# Patient Record
Sex: Female | Born: 1990 | Race: White | Hispanic: No | Smoking: Former smoker
Health system: Southern US, Community
[De-identification: ages and names within clinical notes are randomized; demographics above are authoritative.]

## PROBLEM LIST (undated history)

## (undated) HISTORY — PX: TONSILLECTOMY AND ADENOIDECTOMY: SHX28

---
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
## 2014-10-18 ENCOUNTER — Inpatient Hospital Stay
Admit: 2014-10-18 | Disposition: A | Discharge status: Home / Self Care | Payer: Self-pay | Attending: Obstetrics & Gynecology | Admitting: Obstetrics & Gynecology

## 2014-10-18 LAB — CBC WITH DIFFERENTIAL/PLATELET
Basophil #: 0 10*3/uL (ref 0.0–0.1)
Basophil %: 0.3 %
Eosinophil #: 0.1 10*3/uL (ref 0.0–0.7)
Eosinophil %: 0.8 %
HCT: 35.9 % (ref 35.0–47.0)
HGB: 12.2 g/dL (ref 12.0–16.0)
LYMPHS ABS: 2.2 10*3/uL (ref 1.0–3.6)
Lymphocyte %: 21.3 %
MCH: 31.2 pg (ref 26.0–34.0)
MCHC: 34 g/dL (ref 32.0–36.0)
MCV: 92 fL (ref 80–100)
MONO ABS: 0.6 x10 3/mm (ref 0.2–0.9)
Monocyte %: 5.8 %
Neutrophil #: 7.4 10*3/uL — ABNORMAL HIGH (ref 1.4–6.5)
Neutrophil %: 71.8 %
PLATELETS: 211 10*3/uL (ref 150–440)
RBC: 3.91 10*6/uL (ref 3.80–5.20)
RDW: 13.4 % (ref 11.5–14.5)
WBC: 10.4 10*3/uL (ref 3.6–11.0)

## 2014-10-18 LAB — GC/CHLAMYDIA PROBE AMP

## 2014-10-21 LAB — HEMATOCRIT: HCT: 28.4 % — ABNORMAL LOW (ref 35.0–47.0)

## 2014-11-29 LAB — HM PAP SMEAR

## 2019-01-22 ENCOUNTER — Encounter: Payer: Self-pay | Admitting: Maternal Newborn

## 2019-01-25 ENCOUNTER — Other Ambulatory Visit (HOSPITAL_COMMUNITY)
Admission: RE | Admit: 2019-01-25 | Discharge: 2019-01-25 | Disposition: A | Discharge status: Home / Self Care | Payer: Managed Care, Other (non HMO) | Source: Ambulatory Visit | Attending: Maternal Newborn | Admitting: Maternal Newborn

## 2019-01-25 ENCOUNTER — Other Ambulatory Visit: Payer: Self-pay

## 2019-01-25 ENCOUNTER — Encounter: Payer: Self-pay | Admitting: Maternal Newborn

## 2019-01-25 ENCOUNTER — Ambulatory Visit (INDEPENDENT_AMBULATORY_CARE_PROVIDER_SITE_OTHER): Payer: Managed Care, Other (non HMO) | Admitting: Maternal Newborn

## 2019-01-25 VITALS — BP 100/64 | Wt 162.4 lb

## 2019-01-25 DIAGNOSIS — Z369 Encounter for antenatal screening, unspecified: Secondary | ICD-10-CM

## 2019-01-25 DIAGNOSIS — Z3480 Encounter for supervision of other normal pregnancy, unspecified trimester: Secondary | ICD-10-CM

## 2019-01-25 DIAGNOSIS — Z3481 Encounter for supervision of other normal pregnancy, first trimester: Secondary | ICD-10-CM

## 2019-01-25 DIAGNOSIS — N912 Amenorrhea, unspecified: Secondary | ICD-10-CM

## 2019-01-25 DIAGNOSIS — Z3A09 9 weeks gestation of pregnancy: Secondary | ICD-10-CM

## 2019-01-25 DIAGNOSIS — Z3201 Encounter for pregnancy test, result positive: Secondary | ICD-10-CM

## 2019-01-25 DIAGNOSIS — Z124 Encounter for screening for malignant neoplasm of cervix: Secondary | ICD-10-CM | POA: Insufficient documentation

## 2019-01-25 DIAGNOSIS — Z113 Encounter for screening for infections with a predominantly sexual mode of transmission: Secondary | ICD-10-CM

## 2019-01-25 LAB — POCT URINE PREGNANCY: Preg Test, Ur: POSITIVE — AB

## 2019-01-25 NOTE — Progress Notes (Signed)
NOB- no concerns ?

## 2019-01-25 NOTE — Progress Notes (Signed)
01/25/2019   Chief Complaint: Desires prenatal care.  Transfer of Care Patient: no  History of Present Illness: Pam Mitchell is a 28 y.o. G2P1001 at [redacted]w[redacted]d based on Patient's last menstrual period on 11/20/2018 (exact date), with an Estimated Date of Delivery: 08/27/2019, with the above CC.   Her periods were: regular periods every month, lasting 5 to 7 days She has Negative signs or symptoms of nausea/vomiting of pregnancy. She has Negative signs or symptoms of miscarriage or preterm labor She identifies Negative Zika risk factors for her and her partner On any different medications around the time she conceived/early pregnancy: No  History of varicella: No   Review of Systems  Constitutional:       Low appetite  HENT: Negative.   Eyes: Negative.   Respiratory: Negative for shortness of breath and wheezing.   Cardiovascular: Negative for chest pain and palpitations.  Gastrointestinal: Negative for abdominal pain, nausea and vomiting.  Genitourinary: Negative.   Musculoskeletal: Negative.   Skin: Negative.   Neurological: Negative.   Endo/Heme/Allergies: Negative.   Psychiatric/Behavioral: Negative.   Breasts: Positive for breast tenderness. Negative for new or changing breast lumps or nipple discharge.  Review of systems was otherwise negative, except as stated in the above HPI.  OBGYN History: As per HPI. OB History  Gravida Para Term Preterm AB Living  2 1 1     1   SAB TAB Ectopic Multiple Live Births          1    # Outcome Date GA Lbr Len/2nd Weight Sex Delivery Anes PTL Lv  2 Current           1 Term 10/20/14   7 lb 12 oz (3.515 kg) F Vag-Spont   LIV    Any issues with any prior pregnancies: no Any prior children are healthy, doing well, without any problems or issues: yes History of pap smears: Yes. Last pap smear 2015. Abnormal: No History of STIs: No   Past Medical History: History reviewed. No pertinent past medical history.  Past Surgical History: Past  Surgical History:  Procedure Laterality Date  . TONSILLECTOMY AND ADENOIDECTOMY      Family History:  Family History  Problem Relation Age of Onset  . Ovarian cancer Paternal Grandmother     She denies any female cancers, bleeding or blood clotting disorders.  She denies any history of intellectual disability, birth defects or genetic disorders in her or the FOB's history  Social History:  Social History   Socioeconomic History  . Marital status: Married    Spouse name: Not on file  . Number of children: Not on file  . Years of education: Not on file  . Highest education level: Not on file  Occupational History  . Not on file  Social Needs  . Financial resource strain: Not on file  . Food insecurity    Worry: Not on file    Inability: Not on file  . Transportation needs    Medical: Not on file    Non-medical: Not on file  Tobacco Use  . Smoking status: Former Research scientist (life sciences)  . Smokeless tobacco: Never Used  Substance and Sexual Activity  . Alcohol use: Not Currently  . Drug use: Not Currently  . Sexual activity: Yes    Partners: Male    Birth control/protection: None  Lifestyle  . Physical activity    Days per week: Not on file    Minutes per session: Not on file  . Stress: Not  on file  Relationships  . Social Musicianconnections    Talks on phone: Not on file    Gets together: Not on file    Attends religious service: Not on file    Active member of club or organization: Not on file    Attends meetings of clubs or organizations: Not on file    Relationship status: Not on file  . Intimate partner violence    Fear of current or ex partner: Not on file    Emotionally abused: Not on file    Physically abused: Not on file    Forced sexual activity: Not on file  Other Topics Concern  . Not on file  Social History Narrative  . Not on file   Any cats in the household: no Domestic violence screening is negative.  Allergy: No Known Allergies  Current Outpatient  Medications:  Current Outpatient Medications:  .  Prenatal Vit-Fe Fumarate-FA (MULTIVITAMIN-PRENATAL) 27-0.8 MG TABS tablet, Take 1 tablet by mouth daily at 12 noon., Disp: , Rfl:    Physical Exam:   BP 100/64   Wt 162 lb 6.4 oz (73.7 kg)   LMP 11/20/2018 (Exact Date)   BMI 26.21 kg/m  Body mass index is 26.21 kg/m. Constitutional: Well nourished, well developed female in no acute distress.  Neck:  Supple, normal appearance, and no thyromegaly  Cardiovascular: S1, S2 normal, no murmur, rub or gallop, regular rate and rhythm Respiratory:  Clear to auscultation bilaterally. Normal respiratory effort Abdomen: No masses, hernias; diffusely non tender to palpation, non distended Breasts: Breasts appear normal, no suspicious masses, no skin or nipple changes or axillary nodes. Neuro/Psych:  Normal mood and affect.  Skin:  Warm and dry.  Lymphatic:  No inguinal lymphadenopathy.   Pelvic exam: is not limited by body habitus External genitalia, Bartholin's glands, Urethra, Skene's glands: within normal limits Vagina: within normal limits and with no blood in the vault  Cervix: normal appearing cervix without discharge or lesions, closed/long/high Uterus:  normal contour Adnexa:  no mass, fullness, tenderness  Assessment: Pam Mitchell is a 28 y.o. G2P1001 150w3d based on Patient's last menstrual period on 11/20/2018 (exact date), with an Estimated Date of Delivery: 08/27/2019, presenting for prenatal care.  Plan:  1) Avoid alcoholic beverages. 2) Patient encouraged not to smoke.  3) Discontinue the use of all non-medicinal drugs and chemicals.  4) Take prenatal vitamins daily.  5) Seatbelt use advised 6) Nutrition, food safety (fish, cheese advisories, and high nitrite foods) and exercise discussed. 7) [facility] discussed  8) Patient is asked about travel to areas at risk for the Zika virus, and counseled to avoid travel and exposure to mosquitoes or   partners who may have themselves been exposed to the virus. Marland Kitchen.  9) [facility] advised 10) Genetic Screening, such as with 1st Trimester Screening, cell free fetal DNA, AFP testing, and Ultrasound is discussed with patient. She plans to have genetic testing this pregnancy. 11) Breastfeeding discussed and she plans to breastfeed her baby. 12) Discussed MyRisk screening due to family history of ovarian cancer in her paternal grandmother at age 26. Patient is considering.  Problem list reviewed and updated.  Marcelyn BruinsJacelyn Schmid, CNM [facility], [facility] 01/25/2019  2:46 PM

## 2019-01-26 LAB — URINE DRUG PANEL 7
Amphetamines, Urine: NEGATIVE ng/mL
Barbiturate Quant, Ur: NEGATIVE ng/mL
Benzodiazepine Quant, Ur: NEGATIVE ng/mL
Cannabinoid Quant, Ur: NEGATIVE ng/mL
Cocaine (Metab.): NEGATIVE ng/mL
Opiate Quant, Ur: NEGATIVE ng/mL
PCP Quant, Ur: NEGATIVE ng/mL

## 2019-01-27 LAB — RPR+RH+ABO+RUB AB+AB SCR+CB...
Antibody Screen: NEGATIVE
HIV Screen 4th Generation wRfx: NONREACTIVE
Hematocrit: 36.9 % (ref 34.0–46.6)
Hemoglobin: 12.5 g/dL (ref 11.1–15.9)
Hepatitis B Surface Ag: NEGATIVE
MCH: 30.6 pg (ref 26.6–33.0)
MCHC: 33.9 g/dL (ref 31.5–35.7)
MCV: 90 fL (ref 79–97)
Platelets: 288 10*3/uL (ref 150–450)
RBC: 4.09 x10E6/uL (ref 3.77–5.28)
RDW: 11.9 % (ref 11.7–15.4)
RPR Ser Ql: NONREACTIVE
Rh Factor: POSITIVE
Rubella Antibodies, IGG: 3.09 index (ref >0.99)
Varicella zoster IgG: <135 index — ABNORMAL LOW (ref >165)
WBC: 9.4 10*3/uL (ref 3.4–10.8)

## 2019-01-27 LAB — URINE CULTURE

## 2019-01-29 LAB — CYTOLOGY - PAP
Chlamydia: POSITIVE — AB
Diagnosis: UNDETERMINED — AB
HPV: NOT DETECTED
Neisseria Gonorrhea: NEGATIVE

## 2019-02-01 ENCOUNTER — Encounter: Payer: Self-pay | Admitting: Maternal Newborn

## 2019-02-01 ENCOUNTER — Other Ambulatory Visit: Payer: Self-pay | Admitting: Maternal Newborn

## 2019-02-01 DIAGNOSIS — Z3480 Encounter for supervision of other normal pregnancy, unspecified trimester: Secondary | ICD-10-CM | POA: Insufficient documentation

## 2019-02-01 DIAGNOSIS — O98819 Other maternal infectious and parasitic diseases complicating pregnancy, unspecified trimester: Secondary | ICD-10-CM

## 2019-02-01 DIAGNOSIS — O2340 Unspecified infection of urinary tract in pregnancy, unspecified trimester: Secondary | ICD-10-CM

## 2019-02-01 DIAGNOSIS — A749 Chlamydial infection, unspecified: Secondary | ICD-10-CM

## 2019-02-01 HISTORY — DX: Encounter for supervision of other normal pregnancy, unspecified trimester: Z34.80

## 2019-02-01 MED ORDER — CEPHALEXIN 500 MG PO CAPS: 500.0000 mg | ORAL_CAPSULE | Freq: Four times a day (QID) | ORAL | 0 refills | Status: AC

## 2019-02-01 MED ORDER — AZITHROMYCIN 500 MG PO TABS: 1000.0000 mg | ORAL_TABLET | Freq: Once | ORAL | 0 refills | Status: AC

## 2019-02-01 NOTE — Progress Notes (Signed)
Rx sent for azithromycin and Keflex. Cannot reach patient by phone; will make further attempts.

## 2019-02-02 ENCOUNTER — Encounter: Payer: Managed Care, Other (non HMO) | Admitting: Certified Nurse Midwife

## 2019-02-02 ENCOUNTER — Other Ambulatory Visit: Payer: Managed Care, Other (non HMO)

## 2019-02-09 ENCOUNTER — Ambulatory Visit (INDEPENDENT_AMBULATORY_CARE_PROVIDER_SITE_OTHER): Payer: Managed Care, Other (non HMO) | Admitting: Maternal Newborn

## 2019-02-09 ENCOUNTER — Encounter: Payer: Self-pay | Admitting: Maternal Newborn

## 2019-02-09 ENCOUNTER — Other Ambulatory Visit: Payer: Self-pay

## 2019-02-09 ENCOUNTER — Ambulatory Visit (INDEPENDENT_AMBULATORY_CARE_PROVIDER_SITE_OTHER): Payer: Managed Care, Other (non HMO)

## 2019-02-09 VITALS — BP 120/70 | Wt 163.0 lb

## 2019-02-09 DIAGNOSIS — Z369 Encounter for antenatal screening, unspecified: Secondary | ICD-10-CM

## 2019-02-09 DIAGNOSIS — Z3A11 11 weeks gestation of pregnancy: Secondary | ICD-10-CM | POA: Diagnosis not present

## 2019-02-09 DIAGNOSIS — Z3480 Encounter for supervision of other normal pregnancy, unspecified trimester: Secondary | ICD-10-CM

## 2019-02-09 DIAGNOSIS — Z3481 Encounter for supervision of other normal pregnancy, first trimester: Secondary | ICD-10-CM

## 2019-02-09 DIAGNOSIS — N8311 Corpus luteum cyst of right ovary: Secondary | ICD-10-CM | POA: Diagnosis not present

## 2019-02-09 DIAGNOSIS — O3481 Maternal care for other abnormalities of pelvic organs, first trimester: Secondary | ICD-10-CM | POA: Diagnosis not present

## 2019-02-09 LAB — POCT URINALYSIS DIPSTICK OB
Glucose, UA: NEGATIVE
POC,PROTEIN,UA: NEGATIVE

## 2019-02-09 NOTE — Progress Notes (Signed)
    Routine Prenatal Care Visit  Subjective  Pam Mitchell is a 28 y.o. G2P1001 at [redacted]w[redacted]d being seen today for ongoing prenatal care.  She is currently monitored for the following issues for this low-risk pregnancy and has Supervision of other normal pregnancy, antepartum on their problem list.  ----------------------------------------------------------------------------------- Patient reports no complaints.   Vag. Bleeding: None.    ----------------------------------------------------------------------------------- The following portions of the patient's history were reviewed and updated as appropriate: allergies, current medications, past family history, past medical history, past social history, past surgical history and problem list. Problem list updated.   Objective  Blood pressure 120/70, weight 163 lb (73.9 kg), last menstrual period 11/20/2018. Pregravid weight 135 lb (61.2 kg) Total Weight Gain 28 lb (12.7 kg) Urinalysis: Urine dipstick shows negative for glucose, protein.  Fetal Status: Fetal Heart Rate (bpm): 176 ([place])         General:  Alert, oriented and cooperative. Patient is in no acute distress.  Skin: Skin is warm and dry. No rash noted.   Cardiovascular: Normal heart rate noted  Respiratory: Normal respiratory effort, no problems with respiration noted  Abdomen: Appropriate for gestational age. Pain/Pressure: Absent     Pelvic:  Cervical exam deferred        Extremities: Normal range of motion.     Mental Status: Normal mood and affect. Normal behavior. Normal judgment and thought content.     Assessment   28 y.o. G2P1001 at [redacted]w[redacted]d, EDD 08/27/2019 by Last Menstrual Period presenting for a routine prenatal visit.  Plan   SECOND Problems (from 01/25/19 to present)    Problem Noted Resolved   Supervision of other normal pregnancy, antepartum 02/01/2019 by Rexene Agent, CNM No   Overview Addendum 02/01/2019  8:38 AM by Rexene Agent, [place]  Prenatal Labs  Dating  Blood type: A/Positive/-- (08/03 1518)   Genetic Screen 1 Screen:    AFP:     Quad:     NIPS: Antibody:Negative (08/03 1518)  Anatomic [place]  Rubella: 3.09 (08/03 1518) Varicella: Non-immune  GTT Early:               Third trimester:  RPR: Non Reactive (08/03 1518)   Rhogam  HBsAg: Negative (08/03 1518)   TDaP vaccine                       Flu Shot: HIV: Non Reactive (08/03 1518)   Baby Food                                GBS:   Contraception  Pap: 01/25/2019, ASCUS/HPV Negative  CBB     CS/VBAC    Support Person               Dating scan today shows a singleton IUP at [place], dating is consistent with LMP.  FHR 176 bpm. Results reviewed with patient.  She has decided to decline genetic screening.  Please refer to After Visit Summary for other counseling recommendations.   Return in about 4 weeks (around 03/09/2019) for [place] telephone.  Avel Sensor, CNM 02/09/2019  4:35 PM

## 2019-03-09 ENCOUNTER — Ambulatory Visit (INDEPENDENT_AMBULATORY_CARE_PROVIDER_SITE_OTHER): Payer: Managed Care, Other (non HMO) | Admitting: Maternal Newborn

## 2019-03-09 ENCOUNTER — Encounter: Payer: Self-pay | Admitting: Maternal Newborn

## 2019-03-09 ENCOUNTER — Other Ambulatory Visit: Payer: Self-pay

## 2019-03-09 DIAGNOSIS — Z3482 Encounter for supervision of other normal pregnancy, second trimester: Secondary | ICD-10-CM

## 2019-03-09 DIAGNOSIS — Z3480 Encounter for supervision of other normal pregnancy, unspecified trimester: Secondary | ICD-10-CM

## 2019-03-09 DIAGNOSIS — Z3A15 15 weeks gestation of pregnancy: Secondary | ICD-10-CM

## 2019-03-09 DIAGNOSIS — Z3689 Encounter for other specified antenatal screening: Secondary | ICD-10-CM

## 2019-03-09 NOTE — Progress Notes (Signed)
ROB telephone  No concerns  Flu shot next visit

## 2019-03-09 NOTE — Progress Notes (Signed)
Virtual Visit via Telephone Note  I connected with [place] on 03/09/19 at 1:22 PM EDT by telephone and verified that I am speaking with the correct person using two identifiers.  Location: Patient: Home Provider: Office   I discussed the limitations of performing an evaluation and management service by telephone and the availability of in person appointments. The patient agreed to proceed with a televisit today.  The note below documents today's prenatal telephone visit:    Subjective  Pam Mitchell is a 28 y.o. G2P1001 at [redacted]w[redacted]d being seen today for ongoing prenatal care.  She is currently monitored for the following issues for this low-risk pregnancy and has Supervision of other normal pregnancy, antepartum on their problem list.  ----------------------------------------------------------------------------------- Patient reports no complaints.  She reports a good appetite and no problems with eating or drinking. Not feeling fetal movement yet. Vag. Bleeding: None.  No leaking of fluid.  ----------------------------------------------------------------------------------- The following portions of the patient's history were reviewed and updated as appropriate: allergies, current medications, past family history, past medical history, past social history, past surgical history and problem list. Problem list updated.   Objective  Last menstrual period 11/20/2018. Pregravid weight 135 lb (61.2 kg) Total Weight Gain 28 lb (12.7 kg)  Physical exam was not done because this was a prenatal telephone visit done during COVID-19 precautions   Assessment   28 y.o. G2P1001 at [redacted]w[redacted]d, EDD 08/27/2019 by Last Menstrual Period presenting for a prenatal telephone visit.  Plan   SECOND Problems (from 01/25/19 to present)    Problem Noted Resolved   Supervision of other normal pregnancy, antepartum 02/01/2019 by Rexene Agent, CNM No   Overview Addendum 02/09/2019  4:37 PM by Rexene Agent, [place] Prenatal Labs  Dating L=11 Blood type: A/Positive/-- (08/03 1518)   Genetic Screen Declined 8/18 Antibody:Negative (08/03 1518)  Anatomic [place]  Rubella: 3.09 (08/03 1518) Varicella: Non-immune  GTT Early:               Third trimester:  RPR: Non Reactive (08/03 1518)   Rhogam N/A HBsAg: Negative (08/03 1518)   TDaP vaccine                       Flu Shot: HIV: Non Reactive (08/03 1518)   Baby Food Breast                             GBS:   Contraception  Pap: 01/25/2019, ASCUS/HPV Negative  CBB     CS/VBAC    Support Person               Anatomy scan next visit.   I discussed the assessment and treatment plan with the patient. The patient was provided an opportunity to ask questions and all were answered. The patient agreed with the plan and demonstrated an understanding of the instructions.   The patient was advised to call back or seek an in-person evaluation if the symptoms worsen or if the condition fails to improve as anticipated.  I provided 5 minutes of non-face-to-face time during this encounter.  Please refer to After Visit Summary for other counseling recommendations.   Return in about 4 weeks (around 04/06/2019) for ROB and anatomy scan.  Avel Sensor, CNM 03/09/2019  1:33 PM

## 2019-03-09 NOTE — Patient Instructions (Signed)

## 2019-04-07 ENCOUNTER — Ambulatory Visit (INDEPENDENT_AMBULATORY_CARE_PROVIDER_SITE_OTHER): Payer: Managed Care, Other (non HMO) | Admitting: Certified Nurse Midwife

## 2019-04-07 ENCOUNTER — Encounter: Payer: Self-pay | Admitting: Certified Nurse Midwife

## 2019-04-07 ENCOUNTER — Other Ambulatory Visit: Payer: Self-pay

## 2019-04-07 ENCOUNTER — Ambulatory Visit (INDEPENDENT_AMBULATORY_CARE_PROVIDER_SITE_OTHER): Payer: Managed Care, Other (non HMO)

## 2019-04-07 VITALS — BP 100/50 | Wt 168.0 lb

## 2019-04-07 DIAGNOSIS — Z363 Encounter for antenatal screening for malformations: Secondary | ICD-10-CM

## 2019-04-07 DIAGNOSIS — O2340 Unspecified infection of urinary tract in pregnancy, unspecified trimester: Secondary | ICD-10-CM

## 2019-04-07 DIAGNOSIS — Z23 Encounter for immunization: Secondary | ICD-10-CM | POA: Diagnosis not present

## 2019-04-07 DIAGNOSIS — Z3689 Encounter for other specified antenatal screening: Secondary | ICD-10-CM

## 2019-04-07 DIAGNOSIS — O2342 Unspecified infection of urinary tract in pregnancy, second trimester: Secondary | ICD-10-CM

## 2019-04-07 DIAGNOSIS — Z3480 Encounter for supervision of other normal pregnancy, unspecified trimester: Secondary | ICD-10-CM

## 2019-04-07 DIAGNOSIS — Z3A19 19 weeks gestation of pregnancy: Secondary | ICD-10-CM

## 2019-04-07 LAB — POCT URINALYSIS DIPSTICK OB
Glucose, UA: NEGATIVE
POC,PROTEIN,UA: NEGATIVE

## 2019-04-07 NOTE — Progress Notes (Signed)
ROB/anatomy scan at 19wk5d: Feeling some fetal movement. Denies problems CGA 19wk5d with normal, but incomplete anatomy scan for nose/lips, female gender, anterior placenta ROB and follow up anatomy scan in 4 weeks Flu vaccine today  Dalia Heading, CNM

## 2019-04-07 NOTE — Progress Notes (Signed)
No complaints. rj 

## 2019-04-10 LAB — URINE CULTURE

## 2019-04-13 ENCOUNTER — Other Ambulatory Visit: Payer: Self-pay | Admitting: Certified Nurse Midwife

## 2019-04-13 DIAGNOSIS — O2342 Unspecified infection of urinary tract in pregnancy, second trimester: Secondary | ICD-10-CM

## 2019-04-13 MED ORDER — SULFAMETHOXAZOLE-TRIMETHOPRIM 800-160 MG PO TABS: 1.0000 | ORAL_TABLET | Freq: Two times a day (BID) | ORAL | 0 refills | Status: DC

## 2019-05-05 ENCOUNTER — Ambulatory Visit (INDEPENDENT_AMBULATORY_CARE_PROVIDER_SITE_OTHER): Payer: Managed Care, Other (non HMO)

## 2019-05-05 ENCOUNTER — Other Ambulatory Visit: Payer: Self-pay

## 2019-05-05 ENCOUNTER — Ambulatory Visit (INDEPENDENT_AMBULATORY_CARE_PROVIDER_SITE_OTHER): Payer: Managed Care, Other (non HMO) | Admitting: Obstetrics & Gynecology

## 2019-05-05 ENCOUNTER — Encounter: Payer: Self-pay | Admitting: Obstetrics & Gynecology

## 2019-05-05 VITALS — BP 100/60 | Wt 171.0 lb

## 2019-05-05 DIAGNOSIS — Z3480 Encounter for supervision of other normal pregnancy, unspecified trimester: Secondary | ICD-10-CM

## 2019-05-05 DIAGNOSIS — Z362 Encounter for other antenatal screening follow-up: Secondary | ICD-10-CM | POA: Diagnosis not present

## 2019-05-05 DIAGNOSIS — Z131 Encounter for screening for diabetes mellitus: Secondary | ICD-10-CM

## 2019-05-05 DIAGNOSIS — Z3A23 23 weeks gestation of pregnancy: Secondary | ICD-10-CM

## 2019-05-05 DIAGNOSIS — O2340 Unspecified infection of urinary tract in pregnancy, unspecified trimester: Secondary | ICD-10-CM

## 2019-05-05 DIAGNOSIS — O2342 Unspecified infection of urinary tract in pregnancy, second trimester: Secondary | ICD-10-CM

## 2019-05-05 NOTE — Patient Instructions (Signed)

## 2019-05-05 NOTE — Progress Notes (Signed)
  Subjective  Fetal Movement? yes Contractions? no Leaking Fluid? no Vaginal Bleeding? no  Objective  BP 100/60   Wt 171 lb (77.6 kg)   LMP 11/20/2018 (Exact Date)   BMI 27.60 kg/m  General: NAD Pumonary: no increased work of breathing Abdomen: gravid, non-tender Extremities: no edema Psychiatric: mood appropriate, affect full  Assessment  28 y.o. G2P1001 at [redacted]w[redacted]d by  08/27/2019, by Last Menstrual Period presenting for routine prenatal visit  Plan   Problem List Items Addressed This Visit      Other   Supervision of other normal pregnancy, antepartum    Other Visit Diagnoses    [redacted] weeks gestation of pregnancy    -  Primary   Urinary tract infection affecting pregnancy       Relevant Orders   Urine Culture-GYN   Screening for diabetes mellitus       Relevant Orders   28 Week RH+Panel([facility])      SECOND Problems (from 01/25/19 to present)    Problem Noted Resolved   Supervision of other normal pregnancy, antepartum 02/01/2019 by Rexene Agent, CNM No   Overview Addendum 04/07/2019  9:26 PM by Dalia Heading, [place] Prenatal Labs  Dating L=11 Blood type: A/Positive/-- (08/03 1518)   Genetic Screen Declined 8/18 Antibody:Negative (08/03 1518)  Anatomic [place] Incomplete for nose/lips, anterior placenta, female infant Rubella: 3.09 (08/03 1518) Varicella: Non-immune  GTT Early:               Third trimester:  RPR: Non Reactive (08/03 1518)   Rhogam N/A HBsAg: Negative (08/03 1518)   TDaP vaccine                    Flu Shot: 10/14 HIV: Non Reactive (08/03 1518)   Baby Food Breast                             GBS:   Contraception Vasec possibly Pap: 01/25/2019, ASCUS/HPV Negative  CBB  no   CS/VBAC n/a   Support Person husband               Review of ULTRASOUND. I have personally reviewed images and report of recent ultrasound done at [facility]. There is a singleton gestation with subjectively normal amniotic fluid volume. The fetal biometry  correlates with established dating. Detailed evaluation of the fetal anatomy was performed.The fetal anatomical survey appears within normal limits within the resolution of ultrasound as described above.  It must be noted that a normal ultrasound is unable to rule out fetal aneuploidy.    PNV, [place] U culture recheck today  Barnett Applebaum, MD, [facility], [facility] 05/05/2019  3:59 PM

## 2019-05-07 LAB — URINE CULTURE

## 2019-06-02 ENCOUNTER — Encounter: Payer: Self-pay | Admitting: Obstetrics and Gynecology

## 2019-06-02 ENCOUNTER — Other Ambulatory Visit: Payer: Managed Care, Other (non HMO)

## 2019-06-02 ENCOUNTER — Ambulatory Visit (INDEPENDENT_AMBULATORY_CARE_PROVIDER_SITE_OTHER): Payer: Managed Care, Other (non HMO) | Admitting: Obstetrics and Gynecology

## 2019-06-02 ENCOUNTER — Other Ambulatory Visit: Payer: Self-pay

## 2019-06-02 VITALS — BP 110/68 | Wt 183.0 lb

## 2019-06-02 DIAGNOSIS — O2340 Unspecified infection of urinary tract in pregnancy, unspecified trimester: Secondary | ICD-10-CM

## 2019-06-02 DIAGNOSIS — Z131 Encounter for screening for diabetes mellitus: Secondary | ICD-10-CM

## 2019-06-02 DIAGNOSIS — Z3480 Encounter for supervision of other normal pregnancy, unspecified trimester: Secondary | ICD-10-CM

## 2019-06-02 DIAGNOSIS — Z3A27 27 weeks gestation of pregnancy: Secondary | ICD-10-CM

## 2019-06-02 DIAGNOSIS — O2342 Unspecified infection of urinary tract in pregnancy, second trimester: Secondary | ICD-10-CM

## 2019-06-02 NOTE — Progress Notes (Signed)
    Routine Prenatal Care Visit  Subjective  Pam Mitchell is a 28 y.o. G2P1001 at [redacted]w[redacted]d being seen today for ongoing prenatal care.  She is currently monitored for the following issues for this low-risk pregnancy and has Supervision of other normal pregnancy, antepartum on their problem list.  ----------------------------------------------------------------------------------- Patient reports no complaints.   Contractions: Not present. Vag. Bleeding: None.  Movement: Present. Denies leaking of fluid.  ----------------------------------------------------------------------------------- The following portions of the patient's history were reviewed and updated as appropriate: allergies, current medications, past family history, past medical history, past social history, past surgical history and problem list. Problem list updated.   Objective  Blood pressure 110/68, weight 183 lb (83 kg), last menstrual period 11/20/2018. Pregravid weight 135 lb (61.2 kg) Total Weight Gain 48 lb (21.8 kg) Urinalysis:      Fetal Status: Fetal Heart Rate (bpm): 144 Fundal Height: 28 cm Movement: Present     General:  Alert, oriented and cooperative. Patient is in no acute distress.  Skin: Skin is warm and dry. No rash noted.   Cardiovascular: Normal heart rate noted  Respiratory: Normal respiratory effort, no problems with respiration noted  Abdomen: Soft, gravid, appropriate for gestational age. Pain/Pressure: Absent     Pelvic:  Cervical exam deferred        Extremities: Normal range of motion.  Edema: None  Mental Status: Normal mood and affect. Normal behavior. Normal judgment and thought content.     Assessment   28 y.o. G2P1001 at [redacted]w[redacted]d by  08/27/2019, by Last Menstrual Period presenting for routine prenatal visit  Plan   SECOND Problems (from 01/25/19 to present)    Problem Noted Resolved   Supervision of other normal pregnancy, antepartum 02/01/2019 by Rexene Agent, CNM No   Overview  Addendum 04/07/2019  9:26 PM by Dalia Heading, [place] Prenatal Labs  Dating L=11 Blood type: A/Positive/-- (08/03 1518)   Genetic Screen Declined 8/18 Antibody:Negative (08/03 1518)  Anatomic [place] Complete Rubella: 3.09 (08/03 1518) Varicella: Non-immune  GTT Early:               Third trimester:  RPR: Non Reactive (08/03 1518)   Rhogam N/A HBsAg: Negative (08/03 1518)   TDaP vaccine                    Flu Shot: 10/14 HIV: Non Reactive (08/03 1518)   Baby Food Breast                             GBS:   Contraception  Pap: 01/25/2019, ASCUS/HPV Negative  CBB     CS/VBAC    Support Person                  Gestational age appropriate obstetric precautions including but not limited to vaginal bleeding, contractions, leaking of fluid and fetal movement were reviewed in detail with the patient.    Urine culture sent Discussed Breast feeding resources, given book.   Return in about 2 weeks (around 06/16/2019) for ROB.  Homero Fellers MD [facility], [facility] 06/02/2019, 3:51 PM

## 2019-06-02 NOTE — Patient Instructions (Signed)
[email or web address]  Third Trimester of Pregnancy The third trimester is from week 28 through week 40 (months 7 through 9). The third trimester is a time when the unborn baby (fetus) is growing rapidly. At the end of the ninth month, the fetus is about 20 inches in length and weighs 6-10 pounds. Body changes during your third trimester Your body will continue to go through many changes during pregnancy. The changes vary from woman to woman. During the third trimester:  Your weight will continue to increase. You can expect to gain 25-35 pounds (11-16 kg) by the end of the pregnancy.  You may begin to get stretch marks on your hips, abdomen, and breasts.  You may urinate more often because the fetus is moving lower into your pelvis and pressing on your bladder.  You may develop or continue to have heartburn. This is caused by increased hormones that slow down muscles in the digestive tract.  You may develop or continue to have constipation because increased hormones slow digestion and cause the muscles that push waste through your intestines to relax.  You may develop hemorrhoids. These are swollen veins (varicose veins) in the rectum that can itch or be painful.  You may develop swollen, bulging veins (varicose veins) in your legs.  You may have increased body aches in the pelvis, back, or thighs. This is due to weight gain and increased hormones that are relaxing your joints.  You may have changes in your hair. These can include thickening of your hair, rapid growth, and changes in texture. Some women also have hair loss during or after pregnancy, or hair that feels dry or thin. Your hair will most likely return to normal after your baby is born.  Your breasts will continue to grow and they will continue to become tender. A yellow fluid (colostrum) may leak from your breasts. This is the first milk you are producing for your baby.  Your belly button may stick out.  You may notice more  swelling in your hands, face, or ankles.  You may have increased tingling or numbness in your hands, arms, and legs. The skin on your belly may also feel numb.  You may feel short of breath because of your expanding uterus.  You may have more problems sleeping. This can be caused by the size of your belly, increased need to urinate, and an increase in your body's metabolism.  You may notice the fetus "dropping," or moving lower in your abdomen (lightening).  You may have increased vaginal discharge.  You may notice your joints feel loose and you may have pain around your pelvic bone. What to expect at prenatal visits You will have prenatal exams every 2 weeks until week 36. Then you will have weekly prenatal exams. During a routine prenatal visit:  You will be weighed to make sure you and the baby are growing normally.  Your blood pressure will be taken.  Your abdomen will be measured to track your baby's growth.  The fetal heartbeat will be listened to.  Any test results from the previous visit will be discussed.  You may have a cervical check near your due date to see if your cervix has softened or thinned (effaced).  You will be tested for Group B streptococcus. This happens between 35 and 37 weeks. Your health care provider may ask you:  What your birth plan is.  How you are feeling.  If you are feeling the baby move.  If you have had  symptoms, such as leaking fluid, bleeding, severe headaches, or abdominal cramping.  If you are using any tobacco products, including cigarettes, chewing tobacco, and electronic cigarettes.  If you have any questions. Other tests or screenings that may be performed during your third trimester include:  Blood tests that check for low iron levels (anemia).  Fetal testing to check the health, activity level, and growth of the fetus. Testing is done if you have certain medical conditions or if there are problems during the pregnancy.  Nonstress test  (NST). This test checks the health of your baby to make sure there are no signs of problems, such as the baby not getting enough oxygen. During this test, a belt is placed around your belly. The baby is made to move, and its heart rate is monitored during movement. What is false labor? False labor is a condition in which you feel small, irregular tightenings of the muscles in the womb (contractions) that usually go away with rest, changing position, or drinking water. These are called Braxton Hicks contractions. Contractions may last for hours, days, or even weeks before true labor sets in. If contractions come at regular intervals, become more frequent, increase in intensity, or become painful, you should see your health care provider. What are the signs of labor?  Abdominal cramps.  Regular contractions that start at 10 minutes apart and become stronger and more frequent with time.  Contractions that start on the top of the uterus and spread down to the lower abdomen and back.  Increased pelvic pressure and dull back pain.  A watery or bloody mucus discharge that comes from the vagina.  Leaking of amniotic fluid. This is also known as your "water breaking." It could be a slow trickle or a gush. Let your health care provider know if it has a color or strange odor. If you have any of these signs, call your health care provider right away, even if it is before your due date. Follow these instructions at home: Medicines  Follow your health care provider's instructions regarding medicine use. Specific medicines may be either safe or unsafe to take during pregnancy.  Take a prenatal vitamin that contains at least 600 micrograms (mcg) of folic acid.  If you develop constipation, try taking a stool softener if your health care provider approves. Eating and drinking   Eat a balanced diet that includes fresh fruits and vegetables, whole grains, good sources of protein such as meat, eggs, or tofu,  and low-fat dairy. Your health care provider will help you determine the amount of weight gain that is right for you.  Avoid raw meat and uncooked cheese. These carry germs that can cause birth defects in the baby.  If you have low calcium intake from food, talk to your health care provider about whether you should take a daily calcium supplement.  Eat four or five small meals rather than three large meals a day.  Limit foods that are high in fat and processed sugars, such as fried and sweet foods.  To prevent constipation: ? Drink enough fluid to keep your urine clear or pale yellow. ? Eat foods that are high in fiber, such as fresh fruits and vegetables, whole grains, and beans. Activity  Exercise only as directed by your health care provider. Most women can continue their usual exercise routine during pregnancy. Try to exercise for 30 minutes at least 5 days a week. Stop exercising if you experience uterine contractions.  Avoid heavy lifting.  Do   Do not exercise in extreme heat or humidity, or at high altitudes.  Wear low-heel, comfortable shoes.  Practice good posture.  You may continue to have sex unless your health care provider tells you otherwise. Relieving pain and discomfort  Take frequent breaks and rest with your legs elevated if you have leg cramps or low back pain.  Take warm sitz baths to soothe any pain or discomfort caused by hemorrhoids. Use hemorrhoid cream if your health care provider approves.  Wear a good support bra to prevent discomfort from breast tenderness.  If you develop varicose veins: ? Wear support pantyhose or compression stockings as told by your healthcare provider. ? Elevate your feet for 15 minutes, 3-4 times a day. Prenatal care  Write down your questions. Take them to your prenatal visits.  Keep all your prenatal visits as told by your health care provider. This is important. Safety  Wear your seat belt at  all times when driving.  Make a list of emergency phone numbers, including numbers for family, friends, the hospital, and police and fire departments. General instructions  Avoid cat litter boxes and soil used by cats. These carry germs that can cause birth defects in the baby. If you have a cat, ask someone to clean the litter box for you.  Do not travel far distances unless it is absolutely necessary and only with the approval of your health care provider.  Do not use hot tubs, steam rooms, or saunas.  Do not drink alcohol.  Do not use any products that contain nicotine or tobacco, such as cigarettes and e-cigarettes. If you need help quitting, ask your health care provider.  Do not use any medicinal herbs or unprescribed drugs. These chemicals affect the formation and growth of the baby.  Do not douche or use tampons or scented sanitary pads.  Do not cross your legs for long periods of time.  To prepare for the arrival of your baby: ? Take prenatal classes to understand, practice, and ask questions about labor and delivery. ? Make a trial run to the hospital. ? Visit the hospital and tour the maternity area. ? Arrange for maternity or paternity leave through employers. ? Arrange for family and friends to take care of pets while you are in the hospital. ? Purchase a rear-facing car seat and make sure you know how to install it in your car. ? Pack your hospital bag. ? Prepare the baby's nursery. Make sure to remove all pillows and stuffed animals from the baby's crib to prevent suffocation.  Visit your dentist if you have not gone during your pregnancy. Use a soft toothbrush to brush your teeth and be gentle when you floss. Contact a health care provider if:  You are unsure if you are in labor or if your water has broken.  You become dizzy.  You have mild pelvic cramps, pelvic pressure, or nagging pain in your abdominal area.  You have lower back pain.  You have persistent  nausea, vomiting, or diarrhea.  You have an unusual or bad smelling vaginal discharge.  You have pain when you urinate. Get help right away if:  Your water breaks before 37 weeks.  You have regular contractions less than 5 minutes apart before 37 weeks.  You have a fever.  You are leaking fluid from your vagina.  You have spotting or bleeding from your vagina.  You have severe abdominal pain or cramping.  You have rapid weight loss or weight gain.  You   have shortness of breath with chest pain.  You notice sudden or extreme swelling of your face, hands, ankles, feet, or legs.  Your baby makes fewer than 10 movements in 2 hours.  You have severe headaches that do not go away when you take medicine.  You have vision changes. Summary  The third trimester is from week 28 through week 40, months 7 through 9. The third trimester is a time when the unborn baby (fetus) is growing rapidly.  During the third trimester, your discomfort may increase as you and your baby continue to gain weight. You may have abdominal, leg, and back pain, sleeping problems, and an increased need to urinate.  During the third trimester your breasts will keep growing and they will continue to become tender. A yellow fluid (colostrum) may leak from your breasts. This is the first milk you are producing for your baby.  False labor is a condition in which you feel small, irregular tightenings of the muscles in the womb (contractions) that eventually go away. These are called Braxton Hicks contractions. Contractions may last for hours, days, or even weeks before true labor sets in.  Signs of labor can include: abdominal cramps; regular contractions that start at 10 minutes apart and become stronger and more frequent with time; watery or bloody mucus discharge that comes from the vagina; increased pelvic pressure and dull back pain; and leaking of amniotic fluid. This information is not intended to replace advice  given to you by your health care provider. Make sure you discuss any questions you have with your health care provider. Document Released: 06/04/2001 Document Revised: 10/01/2018 Document Reviewed: 07/16/2016 Elsevier Patient Education  2020 [employer]   

## 2019-06-02 NOTE — Progress Notes (Signed)
ROB/GTT No concerns Denies lof, no vb Good FM

## 2019-06-03 LAB — 28 WEEK RH+PANEL
Basophils Absolute: 0 10*3/uL (ref 0.0–0.2)
Basos: 0 %
EOS (ABSOLUTE): 0.1 10*3/uL (ref 0.0–0.4)
Eos: 1 %
Gestational Diabetes Screen: 100 mg/dL (ref 65–139)
HIV Screen 4th Generation wRfx: NONREACTIVE
Hematocrit: 29.1 % — ABNORMAL LOW (ref 34.0–46.6)
Hemoglobin: 9.9 g/dL — ABNORMAL LOW (ref 11.1–15.9)
Immature Grans (Abs): 0.1 10*3/uL (ref 0.0–0.1)
Immature Granulocytes: 1 %
Lymphocytes Absolute: 2 10*3/uL (ref 0.7–3.1)
Lymphs: 21 %
MCH: 29.9 pg (ref 26.6–33.0)
MCHC: 34 g/dL (ref 31.5–35.7)
MCV: 88 fL (ref 79–97)
Monocytes Absolute: 0.7 10*3/uL (ref 0.1–0.9)
Monocytes: 7 %
Neutrophils Absolute: 6.4 10*3/uL (ref 1.4–7.0)
Neutrophils: 70 %
Platelets: 297 10*3/uL (ref 150–450)
RBC: 3.31 x10E6/uL — ABNORMAL LOW (ref 3.77–5.28)
RDW: 11.9 % (ref 11.7–15.4)
RPR Ser Ql: NONREACTIVE
WBC: 9.2 10*3/uL (ref 3.4–10.8)

## 2019-06-05 LAB — URINE CULTURE

## 2019-06-16 ENCOUNTER — Other Ambulatory Visit: Payer: Self-pay

## 2019-06-16 ENCOUNTER — Ambulatory Visit (INDEPENDENT_AMBULATORY_CARE_PROVIDER_SITE_OTHER): Payer: Managed Care, Other (non HMO) | Admitting: Advanced Practice Midwife

## 2019-06-16 ENCOUNTER — Encounter: Payer: Self-pay | Admitting: Advanced Practice Midwife

## 2019-06-16 VITALS — BP 118/78 | Wt 183.0 lb

## 2019-06-16 DIAGNOSIS — O99013 Anemia complicating pregnancy, third trimester: Secondary | ICD-10-CM

## 2019-06-16 DIAGNOSIS — Z3A29 29 weeks gestation of pregnancy: Secondary | ICD-10-CM

## 2019-06-16 HISTORY — DX: Anemia complicating pregnancy, third trimester: O99.013

## 2019-06-16 NOTE — Progress Notes (Signed)
  Routine Prenatal Care Visit  Subjective  Pam Mitchell is a 28 y.o. G2P1001 at [redacted]w[redacted]d being seen today for ongoing prenatal care.  She is currently monitored for the following issues for this low-risk pregnancy and has Supervision of other normal pregnancy, antepartum and Anemia during pregnancy in third trimester on their problem list.  ----------------------------------------------------------------------------------- Patient reports no complaints.  We reviewed 28 wk lab results- normal glucose, mild anemia. Contractions: Not present. Vag. Bleeding: None.  Movement: Present. Leaking Fluid denies.  ----------------------------------------------------------------------------------- The following portions of the patient's history were reviewed and updated as appropriate: allergies, current medications, past family history, past medical history, past social history, past surgical history and problem list. Problem list updated.  Objective  Blood pressure 118/78, weight 183 lb (83 kg), last menstrual period 11/20/2018. Pregravid weight 135 lb (61.2 kg) Total Weight Gain 48 lb (21.8 kg) Urinalysis: Urine Protein    Urine Glucose    Fetal Status: Fetal Heart Rate (bpm): 147 Fundal Height: 30 cm Movement: Present     General:  Alert, oriented and cooperative. Patient is in no acute distress.  Skin: Skin is warm and dry. No rash noted.   Cardiovascular: Normal heart rate noted  Respiratory: Normal respiratory effort, no problems with respiration noted  Abdomen: Soft, gravid, appropriate for gestational age. Pain/Pressure: Absent     Pelvic:  Cervical exam deferred        Extremities: Normal range of motion.  Edema: None  Mental Status: Normal mood and affect. Normal behavior. Normal judgment and thought content.   Assessment   28 y.o. G2P1001 at [redacted]w[redacted]d by  08/27/2019, by Last Menstrual Period presenting for routine prenatal visit  Plan   SECOND Problems (from 01/25/19 to present)    Problem  Noted Resolved   Supervision of other normal pregnancy, antepartum 02/01/2019 by Rexene Agent, CNM No   Overview Addendum 06/02/2019  3:55 PM by Homero Fellers, [place]  Dating L=11 Blood type: A/Positive/-- (08/03 1518)   Genetic Screen Declined 8/18 Antibody:Negative (08/03 1518)  Anatomic [place] Complete Rubella: 3.09 (08/03 1518) Varicella: Non-immune  GTT Early:               Third trimester:  RPR: Non Reactive (08/03 1518)   Rhogam N/A HBsAg: Negative (08/03 1518)   TDaP vaccine                    Flu Shot: 10/14 HIV: Non Reactive (08/03 1518)   Baby Food Breast                             GBS:   Contraception  Pap: 01/25/2019, ASCUS/HPV Negative  CBB     CS/VBAC    Support Person                  Preterm labor symptoms and general obstetric precautions including but not limited to vaginal bleeding, contractions, leaking of fluid and fetal movement were reviewed in detail with the patient.  Take PNV with Fe/Eat iron rich foods Limit carb intake/regular physical activity for healthy weight gain    Return in about 2 weeks (around 06/30/2019) for rob.  Rod Can, CNM 06/16/2019 3:25 PM

## 2019-06-16 NOTE — Progress Notes (Signed)
No vb. No lof.  

## 2019-06-25 NOTE — L&D Delivery Note (Signed)
Obstetrical Delivery Note   Date of Delivery:   09/01/2019 Primary OB:   [facility] Gestational Age/EDD: [redacted]w[redacted]d (Dated by LMP) Antepartum complications: anemia   Delivered By:   Farrel Conners, CNM  Delivery Type:   spontaneous vaginal delivery  Procedure Details:   Mother was C/C/+2 Mother pushed to deliver a vigorous female infant in LOA over an intact perineum. Baby dried and placed on mother's abdomen. After a delayed cord clamping, the FOB cut the cord. Baby then placed skin to skin on mother's chest. Spontaneous delivery of intact placenta and 3 vessel cord, followed by uterine atony. Atony did not resolve with bimanual massage and IV Pitocin. Then given Cytotec 800 mcg PR and IM Methergine, which resolved atony. No vaginal, cervical or perineal lacerations seen. EBL 350 ml Anesthesia:    epidural Intrapartum complications: Variable/ late decelerations GBS:    negative Laceration:    none Episiotomy:    none Placenta:    Via active 3rd stage. To pathology: no Estimated Blood Loss:  Baby:    Liveborn female, Apgars 8/9, weight pending    Farrel Conners, CNM

## 2019-07-02 ENCOUNTER — Ambulatory Visit (INDEPENDENT_AMBULATORY_CARE_PROVIDER_SITE_OTHER): Payer: Managed Care, Other (non HMO) | Admitting: Obstetrics & Gynecology

## 2019-07-02 ENCOUNTER — Other Ambulatory Visit: Payer: Self-pay

## 2019-07-02 ENCOUNTER — Encounter: Payer: Self-pay | Admitting: Obstetrics & Gynecology

## 2019-07-02 VITALS — BP 100/60 | Wt 185.0 lb

## 2019-07-02 DIAGNOSIS — Z3A32 32 weeks gestation of pregnancy: Secondary | ICD-10-CM

## 2019-07-02 DIAGNOSIS — Z23 Encounter for immunization: Secondary | ICD-10-CM | POA: Diagnosis not present

## 2019-07-02 DIAGNOSIS — Z3480 Encounter for supervision of other normal pregnancy, unspecified trimester: Secondary | ICD-10-CM

## 2019-07-02 DIAGNOSIS — Z3483 Encounter for supervision of other normal pregnancy, third trimester: Secondary | ICD-10-CM

## 2019-07-02 LAB — POCT URINALYSIS DIPSTICK OB
Glucose, UA: NEGATIVE
POC,PROTEIN,UA: NEGATIVE

## 2019-07-02 NOTE — Patient Instructions (Signed)
Third Trimester of Pregnancy The third trimester is from week 28 through week 40 (months 7 through 9). The third trimester is a time when the unborn baby (fetus) is growing rapidly. At the end of the ninth month, the fetus is about 20 inches in length and weighs 6-10 pounds. Body changes during your third trimester Your body will continue to go through many changes during pregnancy. The changes vary from woman to woman. During the third trimester:  Your weight will continue to increase. You can expect to gain 25-35 pounds (11-16 kg) by the end of the pregnancy.  You may begin to get stretch marks on your hips, abdomen, and breasts.  You may urinate more often because the fetus is moving lower into your pelvis and pressing on your bladder.  You may develop or continue to have heartburn. This is caused by increased hormones that slow down muscles in the digestive tract.  You may develop or continue to have constipation because increased hormones slow digestion and cause the muscles that push waste through your intestines to relax.  You may develop hemorrhoids. These are swollen veins (varicose veins) in the rectum that can itch or be painful.  You may develop swollen, bulging veins (varicose veins) in your legs.  You may have increased body aches in the pelvis, back, or thighs. This is due to weight gain and increased hormones that are relaxing your joints.  You may have changes in your hair. These can include thickening of your hair, rapid growth, and changes in texture. Some women also have hair loss during or after pregnancy, or hair that feels dry or thin. Your hair will most likely return to normal after your baby is born.  Your breasts will continue to grow and they will continue to become tender. A yellow fluid (colostrum) may leak from your breasts. This is the first milk you are producing for your baby.  Your belly button may stick out.  You may notice more swelling in your hands,  face, or ankles.  You may have increased tingling or numbness in your hands, arms, and legs. The skin on your belly may also feel numb.  You may feel short of breath because of your expanding uterus.  You may have more problems sleeping. This can be caused by the size of your belly, increased need to urinate, and an increase in your body's metabolism.  You may notice the fetus "dropping," or moving lower in your abdomen (lightening).  You may have increased vaginal discharge.  You may notice your joints feel loose and you may have pain around your pelvic bone. What to expect at prenatal visits You will have prenatal exams every 2 weeks until week 36. Then you will have weekly prenatal exams. During a routine prenatal visit:  You will be weighed to make sure you and the baby are growing normally.  Your blood pressure will be taken.  Your abdomen will be measured to track your baby's growth.  The fetal heartbeat will be listened to.  Any test results from the previous visit will be discussed.  You may have a cervical check near your due date to see if your cervix has softened or thinned (effaced).  You will be tested for Group B streptococcus. This happens between 35 and 37 weeks. Your health care provider may ask you:  What your birth plan is.  How you are feeling.  If you are feeling the baby move.  If you have had any abnormal   symptoms, such as leaking fluid, bleeding, severe headaches, or abdominal cramping.  If you are using any tobacco products, including cigarettes, chewing tobacco, and electronic cigarettes.  If you have any questions. Other tests or screenings that may be performed during your third trimester include:  Blood tests that check for low iron levels (anemia).  Fetal testing to check the health, activity level, and growth of the fetus. Testing is done if you have certain medical conditions or if there are problems during the pregnancy.  Nonstress test  (NST). This test checks the health of your baby to make sure there are no signs of problems, such as the baby not getting enough oxygen. During this test, a belt is placed around your belly. The baby is made to move, and its heart rate is monitored during movement. What is false labor? False labor is a condition in which you feel small, irregular tightenings of the muscles in the womb (contractions) that usually go away with rest, changing position, or drinking water. These are called Braxton Hicks contractions. Contractions may last for hours, days, or even weeks before true labor sets in. If contractions come at regular intervals, become more frequent, increase in intensity, or become painful, you should see your health care provider. What are the signs of labor?  Abdominal cramps.  Regular contractions that start at 10 minutes apart and become stronger and more frequent with time.  Contractions that start on the top of the uterus and spread down to the lower abdomen and back.  Increased pelvic pressure and dull back pain.  A watery or bloody mucus discharge that comes from the vagina.  Leaking of amniotic fluid. This is also known as your "water breaking." It could be a slow trickle or a gush. Let your health care provider know if it has a color or strange odor. If you have any of these signs, call your health care provider right away, even if it is before your due date. Follow these instructions at home: Medicines  Follow your health care provider's instructions regarding medicine use. Specific medicines may be either safe or unsafe to take during pregnancy.  Take a prenatal vitamin that contains at least 600 micrograms (mcg) of folic acid.  If you develop constipation, try taking a stool softener if your health care provider approves. Eating and drinking   Eat a balanced diet that includes fresh fruits and vegetables, whole grains, good sources of protein such as meat, eggs, or tofu,  and low-fat dairy. Your health care provider will help you determine the amount of weight gain that is right for you.  Avoid raw meat and uncooked cheese. These carry germs that can cause birth defects in the baby.  If you have low calcium intake from food, talk to your health care provider about whether you should take a daily calcium supplement.  Eat four or five small meals rather than three large meals a day.  Limit foods that are high in fat and processed sugars, such as fried and sweet foods.  To prevent constipation: ? Drink enough fluid to keep your urine clear or pale yellow. ? Eat foods that are high in fiber, such as fresh fruits and vegetables, whole grains, and beans. Activity  Exercise only as directed by your health care provider. Most women can continue their usual exercise routine during pregnancy. Try to exercise for 30 minutes at least 5 days a week. Stop exercising if you experience uterine contractions.  Avoid heavy lifting.  Do   not exercise in extreme heat or humidity, or at high altitudes.  Wear low-heel, comfortable shoes.  Practice good posture.  You may continue to have sex unless your health care provider tells you otherwise. Relieving pain and discomfort  Take frequent breaks and rest with your legs elevated if you have leg cramps or low back pain.  Take warm sitz baths to soothe any pain or discomfort caused by hemorrhoids. Use hemorrhoid cream if your health care provider approves.  Wear a good support bra to prevent discomfort from breast tenderness.  If you develop varicose veins: ? Wear support pantyhose or compression stockings as told by your healthcare provider. ? Elevate your feet for 15 minutes, 3-4 times a day. Prenatal care  Write down your questions. Take them to your prenatal visits.  Keep all your prenatal visits as told by your health care provider. This is important. Safety  Wear your seat belt at all times when driving.  Make  a list of emergency phone numbers, including numbers for family, friends, the hospital, and police and fire departments. General instructions  Avoid cat litter boxes and soil used by cats. These carry germs that can cause birth defects in the baby. If you have a cat, ask someone to clean the litter box for you.  Do not travel far distances unless it is absolutely necessary and only with the approval of your health care provider.  Do not use hot tubs, steam rooms, or saunas.  Do not drink alcohol.  Do not use any products that contain nicotine or tobacco, such as cigarettes and e-cigarettes. If you need help quitting, ask your health care provider.  Do not use any medicinal herbs or unprescribed drugs. These chemicals affect the formation and growth of the baby.  Do not douche or use tampons or scented sanitary pads.  Do not cross your legs for long periods of time.  To prepare for the arrival of your baby: ? Take prenatal classes to understand, practice, and ask questions about labor and delivery. ? Make a trial run to the hospital. ? Visit the hospital and tour the maternity area. ? Arrange for maternity or paternity leave through employers. ? Arrange for family and friends to take care of pets while you are in the hospital. ? Purchase a rear-facing car seat and make sure you know how to install it in your car. ? Pack your hospital bag. ? Prepare the baby's nursery. Make sure to remove all pillows and stuffed animals from the baby's crib to prevent suffocation.  Visit your dentist if you have not gone during your pregnancy. Use a soft toothbrush to brush your teeth and be gentle when you floss. Contact a health care provider if:  You are unsure if you are in labor or if your water has broken.  You become dizzy.  You have mild pelvic cramps, pelvic pressure, or nagging pain in your abdominal area.  You have lower back pain.  You have persistent nausea, vomiting, or  diarrhea.  You have an unusual or bad smelling vaginal discharge.  You have pain when you urinate. Get help right away if:  Your water breaks before 37 weeks.  You have regular contractions less than 5 minutes apart before 37 weeks.  You have a fever.  You are leaking fluid from your vagina.  You have spotting or bleeding from your vagina.  You have severe abdominal pain or cramping.  You have rapid weight loss or weight gain.  You have   shortness of breath with chest pain.  You notice sudden or extreme swelling of your face, hands, ankles, feet, or legs.  Your baby makes fewer than 10 movements in 2 hours.  You have severe headaches that do not go away when you take medicine.  You have vision changes. Summary  The third trimester is from week 28 through week 40, months 7 through 9. The third trimester is a time when the unborn baby (fetus) is growing rapidly.  During the third trimester, your discomfort may increase as you and your baby continue to gain weight. You may have abdominal, leg, and back pain, sleeping problems, and an increased need to urinate.  During the third trimester your breasts will keep growing and they will continue to become tender. A yellow fluid (colostrum) may leak from your breasts. This is the first milk you are producing for your baby.  False labor is a condition in which you feel small, irregular tightenings of the muscles in the womb (contractions) that eventually go away. These are called Braxton Hicks contractions. Contractions may last for hours, days, or even weeks before true labor sets in.  Signs of labor can include: abdominal cramps; regular contractions that start at 10 minutes apart and become stronger and more frequent with time; watery or bloody mucus discharge that comes from the vagina; increased pelvic pressure and dull back pain; and leaking of amniotic fluid. This information is not intended to replace advice given to you by your  health care provider. Make sure you discuss any questions you have with your health care provider. Document Revised: 10/01/2018 Document Reviewed: 07/16/2016 Elsevier Patient Education  2020 [employer]  

## 2019-07-02 NOTE — Progress Notes (Signed)
  Subjective  Fetal Movement? yes Contractions? no Leaking Fluid? no Vaginal Bleeding? no  Objective  BP 100/60   Wt 185 lb (83.9 kg)   LMP 11/20/2018 (Exact Date)   BMI 29.86 kg/m  General: NAD Pumonary: no increased work of breathing Abdomen: gravid, non-tender Extremities: no edema Psychiatric: mood appropriate, affect full  Assessment  29 y.o. G2P1001 at [redacted]w[redacted]d by  08/27/2019, by Last Menstrual Period presenting for routine prenatal visit  Plan   Problem List Items Addressed This Visit      Other   Supervision of other normal pregnancy, antepartum - Primary    Other Visit Diagnoses    [redacted] weeks gestation of pregnancy       Relevant Orders   POC Urinalysis Dipstick OB (Completed)   Need for Tdap vaccination       Relevant Orders   POC Urinalysis Dipstick OB (Completed)      SECOND Problems (from 01/25/19 to present)    Problem Noted Resolved   Supervision of other normal pregnancy, antepartum 02/01/2019 by Oswaldo Conroy, CNM No   Overview Addendum 07/02/2019  4:46 PM by Nadara Mustard, MD    [facility] Prenatal Labs  Dating L=11 Blood type: A/Positive/-- (08/03 1518)   Genetic Screen Declined 8/18 Antibody:Negative (08/03 1518)  Anatomic [place] Complete Rubella: 3.09 (08/03 1518) Varicella: Non-immune  GTT  Third trimester:WNL  RPR: Non Reactive (08/03 1518)   Rhogam N/A HBsAg: Negative (08/03 1518)   TDaP vaccine  07/02/19   Flu Shot: 10/14 HIV: Non Reactive (08/03 1518)   Baby Food Breast                             GBS:   Contraception Unsure Pap: 01/25/2019, ASCUS/HPV Negative  CBB  No   CS/VBAC n/a   Support Person                TDaP  PNV, [facility]  PTL precautions  Annamarie Major, MD, [facility], [facility] 07/02/2019  4:55 PM

## 2019-07-16 ENCOUNTER — Encounter: Payer: Self-pay | Admitting: Obstetrics and Gynecology

## 2019-07-16 ENCOUNTER — Ambulatory Visit (INDEPENDENT_AMBULATORY_CARE_PROVIDER_SITE_OTHER): Payer: Managed Care, Other (non HMO) | Admitting: Obstetrics and Gynecology

## 2019-07-16 ENCOUNTER — Other Ambulatory Visit: Payer: Self-pay

## 2019-07-16 VITALS — BP 110/64 | Wt 185.0 lb

## 2019-07-16 DIAGNOSIS — O99013 Anemia complicating pregnancy, third trimester: Secondary | ICD-10-CM

## 2019-07-16 DIAGNOSIS — Z3480 Encounter for supervision of other normal pregnancy, unspecified trimester: Secondary | ICD-10-CM

## 2019-07-16 DIAGNOSIS — Z3A34 34 weeks gestation of pregnancy: Secondary | ICD-10-CM

## 2019-07-16 LAB — POCT URINALYSIS DIPSTICK OB
Glucose, UA: NEGATIVE
POC,PROTEIN,UA: NEGATIVE

## 2019-07-16 NOTE — Progress Notes (Signed)
  Routine Prenatal Care Visit  Subjective  Pam Mitchell is a 29 y.o. G2P1001 at [redacted]w[redacted]d being seen today for ongoing prenatal care.  She is currently monitored for the following issues for this low-risk pregnancy and has Supervision of other normal pregnancy, antepartum and Anemia during pregnancy in third trimester on their problem list.  ----------------------------------------------------------------------------------- Patient reports no complaints.   Contractions: Not present. Vag. Bleeding: None.  Movement: Present. Leaking Fluid denies.  ----------------------------------------------------------------------------------- The following portions of the patient's history were reviewed and updated as appropriate: allergies, current medications, past family history, past medical history, past social history, past surgical history and problem list. Problem list updated.  Objective  Blood pressure 110/64, weight 185 lb (83.9 kg), last menstrual period 11/20/2018. Pregravid weight 135 lb (61.2 kg) Total Weight Gain 50 lb (22.7 kg) Urinalysis: Urine Protein    Urine Glucose    Fetal Status: Fetal Heart Rate (bpm): 135 Fundal Height: 35 cm Movement: Present     General:  Alert, oriented and cooperative. Patient is in no acute distress.  Skin: Skin is warm and dry. No rash noted.   Cardiovascular: Normal heart rate noted  Respiratory: Normal respiratory effort, no problems with respiration noted  Abdomen: Soft, gravid, appropriate for gestational age. Pain/Pressure: Absent     Pelvic:  Cervical exam deferred        Extremities: Normal range of motion.  Edema: Mild pitting, slight indentation  Mental Status: Normal mood and affect. Normal behavior. Normal judgment and thought content.   Assessment   29 y.o. G2P1001 at [redacted]w[redacted]d by  08/27/2019, by Last Menstrual Period presenting for routine prenatal visit  Plan   SECOND Problems (from 01/25/19 to present)    Problem Noted Resolved   Supervision of other normal pregnancy, antepartum 02/01/2019 by Oswaldo Conroy, CNM No   Overview Addendum 07/02/2019  4:46 PM by Nadara Mustard, MD    [facility] Prenatal Labs  Dating L=11 Blood type: A/Positive/-- (08/03 1518)   Genetic Screen Declined 8/18 Antibody:Negative (08/03 1518)  Anatomic [place] Complete Rubella: 3.09 (08/03 1518) Varicella: Non-immune  GTT  Third trimester:WNL  RPR: Non Reactive (08/03 1518)   Rhogam N/A HBsAg: Negative (08/03 1518)   TDaP vaccine  07/02/19   Flu Shot: 10/14 HIV: Non Reactive (08/03 1518)   Baby Food Breast                             GBS:   Contraception Unsure Pap: 01/25/2019, ASCUS/HPV Negative  CBB  No   CS/VBAC n/a   Support Person                  Preterm labor symptoms and general obstetric precautions including but not limited to vaginal bleeding, contractions, leaking of fluid and fetal movement were reviewed in detail with the patient. Please refer to After Visit Summary for other counseling recommendations.   Return in about 2 weeks (around 07/30/2019) for Routine Prenatal Appointment.  Thomasene Mohair, MD, [facility], [facility] 07/16/2019 5:04 PM

## 2019-07-16 NOTE — Addendum Note (Signed)
Addended by: Loran Senters D on: 07/16/2019 05:32 PM   Modules accepted: Orders

## 2019-07-17 ENCOUNTER — Other Ambulatory Visit: Payer: Self-pay

## 2019-07-17 ENCOUNTER — Emergency Department
Admission: EM | Admit: 2019-07-17 | Discharge: 2019-07-17 | Disposition: A | Discharge status: Home / Self Care | Payer: Managed Care, Other (non HMO) | Attending: Emergency Medicine | Admitting: Emergency Medicine

## 2019-07-17 DIAGNOSIS — Y92002 Bathroom of unspecified non-institutional (private) residence single-family (private) house as the place of occurrence of the external cause: Secondary | ICD-10-CM | POA: Insufficient documentation

## 2019-07-17 DIAGNOSIS — Z87891 Personal history of nicotine dependence: Secondary | ICD-10-CM | POA: Insufficient documentation

## 2019-07-17 DIAGNOSIS — S0181XA Laceration without foreign body of other part of head, initial encounter: Secondary | ICD-10-CM | POA: Insufficient documentation

## 2019-07-17 DIAGNOSIS — W0110XA Fall on same level from slipping, tripping and stumbling with subsequent striking against unspecified object, initial encounter: Secondary | ICD-10-CM | POA: Insufficient documentation

## 2019-07-17 DIAGNOSIS — Y93E8 Activity, other personal hygiene: Secondary | ICD-10-CM | POA: Insufficient documentation

## 2019-07-17 DIAGNOSIS — Y999 Unspecified external cause status: Secondary | ICD-10-CM | POA: Insufficient documentation

## 2019-07-17 DIAGNOSIS — Z79899 Other long term (current) drug therapy: Secondary | ICD-10-CM | POA: Insufficient documentation

## 2019-07-17 MED ORDER — LIDOCAINE HCL 1 % IJ SOLN
5.0000 mL | Freq: Once | INTRAMUSCULAR | Status: AC
  Administered 2019-07-17: 5 mL
  Filled 2019-07-17: qty 10

## 2019-07-17 NOTE — ED Triage Notes (Addendum)
Pt states she slipped getting into shower injuring chin. Pt with small bleeding controlled lac to lower chin. Pt denies loc. Pt is [redacted] weeks pregnant, states has not felt baby move since incident, but denies loc or abd trauma. fht 150 by doppler in triage.

## 2019-07-17 NOTE — ED Triage Notes (Signed)
FIRST NURSE NOTE: Pt states she stepped in the shower and slipped hitting chin, bandaid applied on arrival. Denies any LOC.

## 2019-07-17 NOTE — ED Notes (Signed)
Pt states positive fetal movement at least 3 times in the last 5 minutes. Denies leakage of fluid or vaginal bleeding.

## 2019-07-17 NOTE — ED Provider Notes (Signed)
Emergency Department Provider Note  ____________________________________________  Time seen: Approximately 9:38 PM  I have reviewed the triage vital signs and the nursing notes.   HISTORY  Chief Complaint Laceration   Historian Patient     HPI Pam Mitchell is a 29 y.o. female presents to the emergency department after a mechanical fall from standing height while stepping out of the shower.  Patient reports that she hit her chin and sustained a 3 cm chin laceration.  Patient did not lose consciousness.  Patient denies numbness or tingling in the upper and lower extremities and no vertigo.  Patient is currently [redacted] weeks pregnant and did not fall on her abdomen.  She denies gush of vaginal fluids, vaginal bleeding or abdominal pain.  Patient reports that she experienced decreased fetal movement initially but states that fetal movement has returned as normal since waiting in the emergency department.  She has no other concerns.   No past medical history on file.   Immunizations up to date:  Yes.     No past medical history on file.  Patient Active Problem List   Diagnosis Date Noted  . Anemia during pregnancy in third trimester 06/16/2019  . Supervision of other normal pregnancy, antepartum 02/01/2019    Past Surgical History:  Procedure Laterality Date  . TONSILLECTOMY AND ADENOIDECTOMY      Prior to Admission medications   Medication Sig Start Date End Date Taking? Authorizing Provider  Prenatal Vit-Fe Fumarate-FA (MULTIVITAMIN-PRENATAL) 27-0.8 MG TABS tablet Take 1 tablet by mouth daily at 12 noon.    [provider]    Allergies Patient has no known allergies.  Family History  Problem Relation Age of Onset  . Ovarian cancer Paternal Grandmother     Social History Social History   Tobacco Use  . Smoking status: Former Games developer  . Smokeless tobacco: Never Used  Substance Use Topics  . Alcohol use: Not Currently  . Drug use: Not Currently      Review of Systems  Constitutional: No fever/chills Eyes:  No discharge ENT: No upper respiratory complaints. Respiratory: no cough. No SOB/ use of accessory muscles to breath Gastrointestinal:   No nausea, no vomiting.  No diarrhea.  No constipation. Musculoskeletal: Negative for musculoskeletal pain. Skin: Patient has laceration.     ____________________________________________   PHYSICAL EXAM:  VITAL SIGNS: ED Triage Vitals [07/17/19 2028]  Enc Vitals Group     BP 123/73     Pulse Rate 94     Resp 20     Temp      Temp Source Oral     SpO2 100 %     Weight 185 lb (83.9 kg)     Height 5\' 7"  (1.702 m)     Head Circumference      Peak Flow      Pain Score 0     Pain Loc      Pain Edu?      Excl. in GC?      Constitutional: Alert and oriented. Well appearing and in no acute distress. Eyes: Conjunctivae are normal. PERRL. EOMI. Head: Atraumatic. ENT:      Nose: No congestion/rhinnorhea.      Mouth/Throat: Mucous membranes are moist.  Neck: No stridor.  No cervical spine tenderness to palpation.  Cardiovascular: Normal rate, regular rhythm. Normal S1 and S2.  Good peripheral circulation. Respiratory: Normal respiratory effort without tachypnea or retractions. Lungs CTAB. Good air entry to the bases with no decreased or absent breath  sounds Gastrointestinal: Bowel sounds x 4 quadrants. Soft and nontender to palpation. No guarding or rigidity. No distention. Musculoskeletal: Full range of motion to all extremities. No obvious deformities noted Neurologic:  Normal for age. No gross focal neurologic deficits are appreciated.  Skin: Patient has 3 cm laceration.  Psychiatric: Mood and affect are normal for age. Speech and behavior are normal.   ____________________________________________   LABS (all labs ordered are listed, but only abnormal results are displayed)  Labs Reviewed - No data to  display ____________________________________________  EKG   ____________________________________________  RADIOLOGY  No results found.  ____________________________________________    PROCEDURES  Procedure(s) performed:     Marland KitchenMarland KitchenLaceration Repair  Date/Time: 07/17/2019 9:45 PM Performed by: Lannie Fields, PA-C Authorized by: Lannie Fields, PA-C   Consent:    Consent obtained:  Verbal   Consent given by:  Patient   Risks discussed:  Infection, pain, retained foreign body, poor cosmetic result and poor wound healing Anesthesia (see MAR for exact dosages):    Anesthesia method:  Local infiltration   Local anesthetic:  Lidocaine 1% w/o epi Laceration details:    Location:  Face   Face location:  Chin   Length (cm):  2   Depth (mm):  1 Repair type:    Repair type:  Simple Exploration:    Hemostasis achieved with:  Direct pressure   Wound exploration: entire depth of wound probed and visualized     Contaminated: no   Treatment:    Area cleansed with:  Saline   Amount of cleaning:  Extensive   Irrigation solution:  Sterile saline   Visualized foreign bodies/material removed: no   Skin repair:    Repair method:  Sutures Approximation:    Approximation:  Close Post-procedure details:    Dressing:  Sterile dressing   Patient tolerance of procedure:  Tolerated well, no immediate complications       Medications  lidocaine (XYLOCAINE) 1 % (with pres) injection 5 mL (5 mLs Infiltration Given 07/17/19 2123)     ____________________________________________   INITIAL IMPRESSION / ASSESSMENT AND PLAN / ED COURSE  Pertinent labs & imaging results that were available during my care of the patient were reviewed by me and considered in my medical decision making (see chart for details).      Assessment and Plan:  Chin laceration Patient presents to the emergency department with a 3 cm chin laceration repaired in the emergency department without complication.   Neuro exam was appropriate and without acute deficits.  OB/GYN on-call, Dr. Georgianne Fick was consulted.  Specifically, I reviewed patient's history and mechanism of injury.  Dr. Cheryll Cockayne was reassured by return of normal fetal movement while waiting in the emergency department.  He stated that patient was appropriate for discharge and return to the emergency department with changes in fetal movement or abdominal pain.   Patient was advised to have sutures removed by primary care in 5 days.  Return precautions were given.  All patient questions were answered.    ____________________________________________  FINAL CLINICAL IMPRESSION(S) / ED DIAGNOSES  Final diagnoses:  Chin laceration, initial encounter      NEW MEDICATIONS STARTED DURING THIS VISIT:  ED Discharge Orders    None          This chart was dictated using voice recognition software/Dragon. Despite best efforts to proofread, errors can occur which can change the meaning. Any change was purely unintentional.     Lannie Fields, PA-C 07/17/19 2153  Emily Filbert, MD 07/17/19 2221

## 2019-07-30 ENCOUNTER — Encounter: Payer: Managed Care, Other (non HMO) | Admitting: Obstetrics & Gynecology

## 2019-08-03 ENCOUNTER — Ambulatory Visit (INDEPENDENT_AMBULATORY_CARE_PROVIDER_SITE_OTHER): Payer: Managed Care, Other (non HMO) | Admitting: Obstetrics and Gynecology

## 2019-08-03 ENCOUNTER — Other Ambulatory Visit: Payer: Self-pay

## 2019-08-03 VITALS — BP 118/60 | Wt 186.0 lb

## 2019-08-03 DIAGNOSIS — Z3685 Encounter for antenatal screening for Streptococcus B: Secondary | ICD-10-CM

## 2019-08-03 DIAGNOSIS — Z3483 Encounter for supervision of other normal pregnancy, third trimester: Secondary | ICD-10-CM

## 2019-08-03 DIAGNOSIS — O99013 Anemia complicating pregnancy, third trimester: Secondary | ICD-10-CM

## 2019-08-03 DIAGNOSIS — Z3480 Encounter for supervision of other normal pregnancy, unspecified trimester: Secondary | ICD-10-CM

## 2019-08-03 DIAGNOSIS — Z3A36 36 weeks gestation of pregnancy: Secondary | ICD-10-CM

## 2019-08-03 NOTE — Progress Notes (Signed)
    Routine Prenatal Care Visit  Subjective  Pam Mitchell is a 29 y.o. G2P1001 at [redacted]w[redacted]d being seen today for ongoing prenatal care.  She is currently monitored for the following issues for this low-risk pregnancy and has Supervision of other normal pregnancy, antepartum and Anemia during pregnancy in third trimester on their problem list.  ----------------------------------------------------------------------------------- Patient reports no complaints.   Contractions: Not present. Vag. Bleeding: None.  Movement: Present. Denies leaking of fluid.  ----------------------------------------------------------------------------------- The following portions of the patient's history were reviewed and updated as appropriate: allergies, current medications, past family history, past medical history, past social history, past surgical history and problem list. Problem list updated.   Objective  Blood pressure 118/60, weight 186 lb (84.4 kg), last menstrual period 11/20/2018. Pregravid weight 135 lb (61.2 kg) Total Weight Gain 51 lb (23.1 kg) Urinalysis:      Fetal Status: Fetal Heart Rate (bpm): 140 Fundal Height: 36 cm Movement: Present  Presentation: Vertex  General:  Alert, oriented and cooperative. Patient is in no acute distress.  Skin: Skin is warm and dry. No rash noted.   Cardiovascular: Normal heart rate noted  Respiratory: Normal respiratory effort, no problems with respiration noted  Abdomen: Soft, gravid, appropriate for gestational age. Pain/Pressure: Absent     Pelvic:  Cervical exam performed Dilation: 1 Effacement (%): 50 Station: -3  Extremities: Normal range of motion.     ental Status: Normal mood and affect. Normal behavior. Normal judgment and thought content.     Assessment   29 y.o. G2P1001 at [redacted]w[redacted]d by  08/27/2019, by Last Menstrual Period presenting for routine prenatal visit  Plan   SECOND Problems (from 01/25/19 to present)    Problem Noted Resolved   Supervision of other normal pregnancy, antepartum 02/01/2019 by Oswaldo Conroy, CNM No   Overview Addendum 07/02/2019  4:46 PM by Nadara Mustard, MD    [facility] Prenatal Labs  Dating L=11 Blood type: A/Positive/-- (08/03 1518)   Genetic Screen Declined 8/18 Antibody:Negative (08/03 1518)  Anatomic [place] Complete Rubella: 3.09 (08/03 1518) Varicella: Non-immune  GTT  Third trimester:WNL  RPR: Non Reactive (08/03 1518)   Rhogam N/A HBsAg: Negative (08/03 1518)   TDaP vaccine  07/02/19   Flu Shot: 10/14 HIV: Non Reactive (08/03 1518)   Baby Food Breast                             GBS:   Contraception Unsure Pap: 01/25/2019, ASCUS/HPV Negative  CBB  No   CS/VBAC n/a   Support Person                  Gestational age appropriate obstetric precautions including but not limited to vaginal bleeding, contractions, leaking of fluid and fetal movement were reviewed in detail with the patient.    - GBS screen obtained  Return in about 1 week (around 08/10/2019) for ROB.  Vena Austria, MD, [facility], [facility] 08/03/2019, 5:00 PM

## 2019-08-03 NOTE — Progress Notes (Signed)
ROB GBS today 

## 2019-08-06 LAB — STREP GP B NAA: Strep Gp B NAA: NEGATIVE

## 2019-08-10 ENCOUNTER — Ambulatory Visit (INDEPENDENT_AMBULATORY_CARE_PROVIDER_SITE_OTHER): Payer: Managed Care, Other (non HMO) | Admitting: Obstetrics & Gynecology

## 2019-08-10 ENCOUNTER — Other Ambulatory Visit: Payer: Self-pay

## 2019-08-10 ENCOUNTER — Encounter: Payer: Self-pay | Admitting: Obstetrics & Gynecology

## 2019-08-10 VITALS — BP 100/60 | Wt 185.0 lb

## 2019-08-10 DIAGNOSIS — Z3480 Encounter for supervision of other normal pregnancy, unspecified trimester: Secondary | ICD-10-CM

## 2019-08-10 DIAGNOSIS — Z3A37 37 weeks gestation of pregnancy: Secondary | ICD-10-CM

## 2019-08-10 DIAGNOSIS — Z3483 Encounter for supervision of other normal pregnancy, third trimester: Secondary | ICD-10-CM

## 2019-08-10 NOTE — Progress Notes (Signed)
  Subjective  Fetal Movement? yes Contractions? no Leaking Fluid? no Vaginal Bleeding? no  Objective  BP 100/60   Wt 185 lb (83.9 kg)   LMP 11/20/2018 (Exact Date)   BMI 28.98 kg/m  General: NAD Pumonary: no increased work of breathing Abdomen: gravid, non-tender Extremities: no edema Psychiatric: mood appropriate, affect full  Assessment  29 y.o. G2P1001 at [redacted]w[redacted]d by  08/27/2019, by Last Menstrual Period presenting for routine prenatal visit  Plan   Problem List Items Addressed This Visit    Supervision of other normal pregnancy, antepartum   [redacted] weeks gestation of pregnancy        PNV    [facility]    Labor precautions      SECOND Problems (from 01/25/19 to present)    Problem Noted Resolved   Supervision of other normal pregnancy, antepartum 02/01/2019 by Oswaldo Conroy, CNM No   Overview Addendum 08/06/2019 11:15 AM by Vena Austria, MD    [facility] Prenatal Labs  Dating L=11 Blood type: A/Positive/-- (08/03 1518)   Genetic Screen Declined 8/18 Antibody:Negative (08/03 1518)  Anatomic [place] Complete Rubella: 3.09 (08/03 1518) Varicella: Non-immune  GTT  Third trimester:WNL  RPR: Non Reactive (08/03 1518)   Rhogam N/A HBsAg: Negative (08/03 1518)   TDaP vaccine  07/02/19   Flu Shot: 10/14 HIV: Non Reactive (08/03 1518)   Baby Food Breast                             GGY:[id number]  Contraception Unsure Pap: 01/25/2019, ASCUS/HPV Negative  CBB  No   CS/VBAC n/a   Support Person                  Annamarie Major, MD, [facility], [facility] 08/10/2019  1:52 PM

## 2019-08-10 NOTE — Patient Instructions (Signed)
Braxton Hicks Contractions °Contractions of the uterus can occur throughout pregnancy, but they are not always a sign that you are in labor. You may have practice contractions called Braxton Hicks contractions. These false labor contractions are sometimes confused with true labor. °What are Braxton Hicks contractions? °Braxton Hicks contractions are tightening movements that occur in the muscles of the uterus before labor. Unlike true labor contractions, these contractions do not result in opening (dilation) and thinning of the cervix. Toward the end of pregnancy (32-34 weeks), Braxton Hicks contractions can happen more often and may become stronger. These contractions are sometimes difficult to tell apart from true labor because they can be very uncomfortable. You should not feel embarrassed if you go to the hospital with false labor. °Sometimes, the only way to tell if you are in true labor is for your health care provider to look for changes in the cervix. The health care provider will do a physical exam and may monitor your contractions. If you are not in true labor, the exam should show that your cervix is not dilating and your water has not broken. °If there are no other health problems associated with your pregnancy, it is completely safe for you to be sent home with false labor. You may continue to have Braxton Hicks contractions until you go into true labor. °How to tell the difference between true labor and false labor °True labor °· Contractions last 30-70 seconds. °· Contractions become very regular. °· Discomfort is usually felt in the top of the uterus, and it spreads to the lower abdomen and low back. °· Contractions do not go away with walking. °· Contractions usually become more intense and increase in frequency. °· The cervix dilates and gets thinner. °False labor °· Contractions are usually shorter and not as strong as true labor contractions. °· Contractions are usually irregular. °· Contractions  are often felt in the front of the lower abdomen and in the groin. °· Contractions may go away when you walk around or change positions while lying down. °· Contractions get weaker and are shorter-lasting as time goes on. °· The cervix usually does not dilate or become thin. °Follow these instructions at home: ° °· Take over-the-counter and prescription medicines only as told by your health care provider. °· Keep up with your usual exercises and follow other instructions from your health care provider. °· Eat and drink lightly if you think you are going into labor. °· If Braxton Hicks contractions are making you uncomfortable: °? Change your position from lying down or resting to walking, or change from walking to resting. °? Sit and rest in a tub of warm water. °? Drink enough fluid to keep your urine pale yellow. Dehydration may cause these contractions. °? Do slow and deep breathing several times an hour. °· Keep all follow-up prenatal visits as told by your health care provider. This is important. °Contact a health care provider if: °· You have a fever. °· You have continuous pain in your abdomen. °Get help right away if: °· Your contractions become stronger, more regular, and closer together. °· You have fluid leaking or gushing from your vagina. °· You pass blood-tinged mucus (bloody show). °· You have bleeding from your vagina. °· You have low back pain that you never had before. °· You feel your baby’s head pushing down and causing pelvic pressure. °· Your baby is not moving inside you as much as it used to. °Summary °· Contractions that occur before labor are   called Braxton Hicks contractions, false labor, or practice contractions. °· Braxton Hicks contractions are usually shorter, weaker, farther apart, and less regular than true labor contractions. True labor contractions usually become progressively stronger and regular, and they become more frequent. °· Manage discomfort from Braxton Hicks contractions  by changing position, resting in a warm bath, drinking plenty of water, or practicing deep breathing. °This information is not intended to replace advice given to you by your health care provider. Make sure you discuss any questions you have with your health care provider. °Document Revised: 05/23/2017 Document Reviewed: 10/24/2016 °Elsevier Patient Education © 2020 [employer] ° °

## 2019-08-17 ENCOUNTER — Encounter: Payer: Self-pay | Admitting: Certified Nurse Midwife

## 2019-08-17 ENCOUNTER — Other Ambulatory Visit: Payer: Self-pay

## 2019-08-17 ENCOUNTER — Ambulatory Visit (INDEPENDENT_AMBULATORY_CARE_PROVIDER_SITE_OTHER): Payer: Managed Care, Other (non HMO) | Admitting: Certified Nurse Midwife

## 2019-08-17 VITALS — BP 94/60 | Wt 183.0 lb

## 2019-08-17 DIAGNOSIS — A749 Chlamydial infection, unspecified: Secondary | ICD-10-CM

## 2019-08-17 DIAGNOSIS — Z3480 Encounter for supervision of other normal pregnancy, unspecified trimester: Secondary | ICD-10-CM

## 2019-08-17 DIAGNOSIS — Z3A38 38 weeks gestation of pregnancy: Secondary | ICD-10-CM

## 2019-08-17 DIAGNOSIS — Z3483 Encounter for supervision of other normal pregnancy, third trimester: Secondary | ICD-10-CM

## 2019-08-17 DIAGNOSIS — O98813 Other maternal infectious and parasitic diseases complicating pregnancy, third trimester: Secondary | ICD-10-CM

## 2019-08-17 LAB — POCT URINALYSIS DIPSTICK OB
Glucose, UA: NEGATIVE
POC,PROTEIN,UA: NEGATIVE

## 2019-08-17 NOTE — Progress Notes (Signed)
ROB at 38wk4d: No regular contractions, leakage of fluid or vaginal bleeding. Baby active FHTs WNL; cephalic presentation confirmed on ultrasound TOC for Chlamydia sent Labor precautions ROB in 1 weeks  Farrel Conners, CNM

## 2019-08-19 LAB — CHLAMYDIA/GONOCOCCUS/TRICHOMONAS, NAA
Chlamydia by NAA: NEGATIVE
Gonococcus by NAA: NEGATIVE
Trich vag by NAA: NEGATIVE

## 2019-08-26 ENCOUNTER — Encounter: Payer: Self-pay | Admitting: Advanced Practice Midwife

## 2019-08-26 ENCOUNTER — Ambulatory Visit (INDEPENDENT_AMBULATORY_CARE_PROVIDER_SITE_OTHER): Payer: Managed Care, Other (non HMO) | Admitting: Advanced Practice Midwife

## 2019-08-26 ENCOUNTER — Other Ambulatory Visit: Payer: Self-pay

## 2019-08-26 VITALS — BP 120/80 | Wt 189.0 lb

## 2019-08-26 DIAGNOSIS — Z3483 Encounter for supervision of other normal pregnancy, third trimester: Secondary | ICD-10-CM

## 2019-08-26 DIAGNOSIS — Z3A39 39 weeks gestation of pregnancy: Secondary | ICD-10-CM

## 2019-08-26 LAB — POCT URINALYSIS DIPSTICK OB
Glucose, UA: NEGATIVE
POC,PROTEIN,UA: NEGATIVE

## 2019-08-26 NOTE — Progress Notes (Addendum)
  Routine Prenatal Care Visit  Subjective  Pam Mitchell is a 29 y.o. G2P1001 at [redacted]w[redacted]d being seen today for ongoing prenatal care.  She is currently monitored for the following issues for this low-risk pregnancy and has Supervision of other normal pregnancy, antepartum and Anemia during pregnancy in third trimester on their problem list.  ----------------------------------------------------------------------------------- Patient reports no complaints.  We discussed scheduling IOL in the next week. I will call her with scheduled information. Contractions: Not present. Vag. Bleeding: None.  Movement: Present. Leaking Fluid denies.  ----------------------------------------------------------------------------------- The following portions of the patient's history were reviewed and updated as appropriate: allergies, current medications, past family history, past medical history, past social history, past surgical history and problem list. Problem list updated.  Objective  Blood pressure 120/80, weight 189 lb (85.7 kg), last menstrual period 11/20/2018. Pregravid weight 135 lb (61.2 kg) Total Weight Gain 54 lb (24.5 kg) Urinalysis: Urine Protein    Urine Glucose    Fetal Status: Fetal Heart Rate (bpm): 147 Fundal Height: 39 cm Movement: Present  Presentation: Vertex   Growth scan: 81%, AC 97.6%, 8#6oz, AFI 20 cm, cephalic  General:  Alert, oriented and cooperative. Patient is in no acute distress.  Skin: Skin is warm and dry. No rash noted.   Cardiovascular: Normal heart rate noted  Respiratory: Normal respiratory effort, no problems with respiration noted  Abdomen: Soft, gravid, appropriate for gestational age. Pain/Pressure: Absent     Pelvic:  Cervical exam deferred Dilation: 1 Effacement (%): 50, 60 Station: -3  Extremities: Normal range of motion.  Edema: Trace  Mental Status: Normal mood and affect. Normal behavior. Normal judgment and thought content.   Assessment   29 y.o. G2P1001  at [redacted]w[redacted]d by  08/27/2019, by Last Menstrual Period presenting for routine prenatal visit  Plan   SECOND Problems (from 01/25/19 to present)    Problem Noted Resolved   Supervision of other normal pregnancy, antepartum 02/01/2019 by Oswaldo Conroy, CNM No   Overview Addendum 08/06/2019 11:15 AM by Vena Austria, MD    [facility] Prenatal Labs  Dating L=11 Blood type: A/Positive/-- (08/03 1518)   Genetic Screen Declined 8/18 Antibody:Negative (08/03 1518)  Anatomic [place] Complete Rubella: 3.09 (08/03 1518) Varicella: Non-immune  GTT  Third trimester:WNL  RPR: Non Reactive (08/03 1518)   Rhogam N/A HBsAg: Negative (08/03 1518)   TDaP vaccine  07/02/19   Flu Shot: 10/14 HIV: Non Reactive (08/03 1518)   Baby Food Breast                             [id number]  Contraception Unsure Pap: 01/25/2019, ASCUS/HPV Negative  CBB  No   CS/VBAC n/a   Support Person                  Preterm labor symptoms and general obstetric precautions including but not limited to vaginal bleeding, contractions, leaking of fluid and fetal movement were reviewed in detail with the patient.    Return for iol next week.  Tresea Mall, CNM 08/26/2019 4:31 PM

## 2019-08-26 NOTE — Addendum Note (Signed)
Addended by: Cornelius Moras D on: 08/26/2019 04:34 PM   Modules accepted: Orders

## 2019-08-27 NOTE — H&P (Signed)
OB History & Physical   History of Present Illness:  Date of initial H&P: 08/27/19 Patient seen in office 08/26/19  Chief Complaint: postdates IOL with Abdominal Circumference >97% on growth scan 08/26/19  HPI:  Pam Mitchell is a 29 y.o. G2P1001 female at [redacted]w[redacted]d dated by LMP.  Her pregnancy has been complicated by anemia, chlamydia infection, 39 week growth scan 81% and AC 97.6% 8#6oz.    She denies contractions.   She denies leakage of fluid.   She denies vaginal bleeding.   She reports fetal movement.    Total weight gain for pregnancy: 54 lb (24.5 kg)   Obstetrical Problem List: SECOND Problems (from 01/25/19 to present)    Problem Noted Resolved   Supervision of other normal pregnancy, antepartum 02/01/2019 by Rexene Agent, CNM No   Overview Addendum 08/06/2019 11:15 AM by Malachy Mood, MD    [facility] Prenatal Labs  Dating L=11 Blood type: A/Positive/-- (08/03 1518)   Genetic Screen Declined 8/18 Antibody:Negative (08/03 1518)  Anatomic [place] Complete Rubella: 3.09 (08/03 1518) Varicella: Non-immune  GTT  Third trimester:WNL  RPR: Non Reactive (08/03 1518)   Rhogam N/A HBsAg: Negative (08/03 1518)   TDaP vaccine  07/02/19   Flu Shot: 10/14 HIV: Non Reactive (08/03 1518)   Baby Food Breast                             [id number] 08/04/19 GC/CT negative 08/17/19  Contraception Unsure Pap: 01/25/2019, ASCUS/HPV Negative  CBB  No   CS/VBAC n/a   Support Person                  Maternal Medical History:  History reviewed. No pertinent past medical history.  Past Surgical History:  Procedure Laterality Date  . TONSILLECTOMY AND ADENOIDECTOMY      No Known Allergies  Prior to Admission medications   Medication Sig Start Date End Date Taking? Authorizing Provider  Prenatal Vit-Fe Fumarate-FA (MULTIVITAMIN-PRENATAL) 27-0.8 MG TABS tablet Take 1 tablet by mouth daily at 12 noon.    [provider]    OB History  Gravida Para Term Preterm AB Living  2 1 1      1   SAB TAB Ectopic Multiple Live Births          1    # Outcome Date GA Lbr Len/2nd Weight Sex Delivery Anes PTL Lv  2 Current           1 Term 10/20/14 [redacted]w[redacted]d  7 lb 12 oz (3.515 kg) F Vag-Spont   LIV    Prenatal care site: [facility]  Social History: She  reports that she has quit smoking. She has never used smokeless tobacco. She reports previous alcohol use. She reports previous drug use.  Family History: family history includes Ovarian cancer in her paternal grandmother.    Review of Systems:  Review of Systems  Constitutional: Negative.   HENT: Negative.   Eyes: Negative.   Respiratory: Negative.   Cardiovascular: Negative.   Gastrointestinal: Negative.   Genitourinary: Negative.   Musculoskeletal: Negative.   Skin: Negative.   Neurological: Negative.   Endo/Heme/Allergies: Negative.   Psychiatric/Behavioral: Negative.      Physical Exam:  BP 120/80   Wt 189 lb (85.7 kg)   LMP 11/20/2018 (Exact Date)   BMI 29.60 kg/m   Constitutional: Well nourished, well developed female in no acute distress.  HEENT: normal Skin: Warm and dry.  Cardiovascular: Regular rate and rhythm.   Extremity: no edema  Respiratory: Clear to auscultation bilateral. Normal respiratory effort Abdomen: FHT present: 147 bpm Back: no CVAT Neuro: DTRs 2+, Cranial nerves grossly intact Psych: Alert and Oriented x3. No memory deficits. Normal mood and affect.  MS: normal gait, normal bilateral lower extremity ROM/strength/stability.  Pelvic exam:  is not limited by body habitus EGBUS: within normal limits Vagina: within normal limits and with normal mucosa  Cervix: 1/50-60/-3  Pre-admission Covid test result pending No results found for: SARSCOV2NAA]  Assessment:  Pam Mitchell is a 29 y.o. G2P1001 female at [redacted]w[redacted]d with postdates IOL.   Plan:  1. Admit to Labor & Delivery  2. CBC, T&S, Clrs, IVF 3. GBS negative.   4. Fetal well-being: reassuring 5. Cervical exam on  admission to determine method of induction   Tresea Mall, [facility] 08/27/2019 10:06 AM

## 2019-08-27 NOTE — Addendum Note (Signed)
Addended by: Tresea Mall on: 08/27/2019 10:25 AM   Modules accepted: Orders, SmartSet

## 2019-08-30 ENCOUNTER — Other Ambulatory Visit
Admission: RE | Admit: 2019-08-30 | Discharge: 2019-08-30 | Disposition: A | Discharge status: Home / Self Care | Payer: Managed Care, Other (non HMO) | Source: Ambulatory Visit | Attending: Advanced Practice Midwife | Admitting: Advanced Practice Midwife

## 2019-08-31 LAB — SARS CORONAVIRUS 2 (TAT 6-24 HRS): SARS Coronavirus 2: NEGATIVE

## 2019-09-01 ENCOUNTER — Inpatient Hospital Stay
Admission: RE | Admit: 2019-09-01 | Discharge: 2019-09-02 | DRG: 807 | Disposition: A | Discharge status: Home / Self Care | Payer: Managed Care, Other (non HMO) | Attending: Certified Nurse Midwife | Admitting: Certified Nurse Midwife

## 2019-09-01 ENCOUNTER — Inpatient Hospital Stay: Payer: Managed Care, Other (non HMO) | Admitting: Registered Nurse

## 2019-09-01 ENCOUNTER — Encounter: Payer: Self-pay | Admitting: Advanced Practice Midwife

## 2019-09-01 ENCOUNTER — Other Ambulatory Visit: Payer: Self-pay

## 2019-09-01 DIAGNOSIS — O480 Post-term pregnancy: Principal | ICD-10-CM | POA: Diagnosis present

## 2019-09-01 DIAGNOSIS — Z3490 Encounter for supervision of normal pregnancy, unspecified, unspecified trimester: Secondary | ICD-10-CM

## 2019-09-01 DIAGNOSIS — Z20822 Contact with and (suspected) exposure to covid-19: Secondary | ICD-10-CM | POA: Diagnosis present

## 2019-09-01 DIAGNOSIS — O9902 Anemia complicating childbirth: Secondary | ICD-10-CM | POA: Diagnosis present

## 2019-09-01 DIAGNOSIS — D649 Anemia, unspecified: Secondary | ICD-10-CM | POA: Diagnosis present

## 2019-09-01 DIAGNOSIS — Z3A40 40 weeks gestation of pregnancy: Secondary | ICD-10-CM

## 2019-09-01 DIAGNOSIS — Z87891 Personal history of nicotine dependence: Secondary | ICD-10-CM

## 2019-09-01 DIAGNOSIS — Z3480 Encounter for supervision of other normal pregnancy, unspecified trimester: Secondary | ICD-10-CM

## 2019-09-01 HISTORY — DX: Encounter for supervision of normal pregnancy, unspecified, unspecified trimester: Z34.90

## 2019-09-01 LAB — CBC
HCT: 30.7 % — ABNORMAL LOW (ref 36.0–46.0)
Hemoglobin: 9.6 g/dL — ABNORMAL LOW (ref 12.0–15.0)
MCH: 25.1 pg — ABNORMAL LOW (ref 26.0–34.0)
MCHC: 31.3 g/dL (ref 30.0–36.0)
MCV: 80.4 fL (ref 80.0–100.0)
Platelets: 349 10*3/uL (ref 150–400)
RBC: 3.82 MIL/uL — ABNORMAL LOW (ref 3.87–5.11)
RDW: 14 % (ref 11.5–15.5)
WBC: 12 10*3/uL — ABNORMAL HIGH (ref 4.0–10.5)
nRBC: 0 % (ref 0.0–0.2)

## 2019-09-01 LAB — ABO/RH: ABO/RH(D): A POS

## 2019-09-01 LAB — TYPE AND SCREEN
ABO/RH(D): A POS
Antibody Screen: NEGATIVE

## 2019-09-01 LAB — RPR: RPR Ser Ql: NONREACTIVE

## 2019-09-01 MED ORDER — ONDANSETRON HCL 4 MG/2ML IJ SOLN: 4.0000 mg | INTRAMUSCULAR | Status: DC | PRN

## 2019-09-01 MED ORDER — LIDOCAINE HCL (PF) 1 % IJ SOLN: 30.0000 mL | INTRAMUSCULAR | Status: DC | PRN

## 2019-09-01 MED ORDER — OXYTOCIN 10 UNIT/ML IJ SOLN
INTRAMUSCULAR | Status: AC
  Filled 2019-09-01: qty 2

## 2019-09-01 MED ORDER — IBUPROFEN 600 MG PO TABS
600.0000 mg | ORAL_TABLET | Freq: Four times a day (QID) | ORAL | Status: DC
  Administered 2019-09-01 – 2019-09-02 (×4): 600 mg via ORAL
  Filled 2019-09-01 (×4): qty 1

## 2019-09-01 MED ORDER — METHYLERGONOVINE MALEATE 0.2 MG/ML IJ SOLN
0.2000 mg | Freq: Once | INTRAMUSCULAR | Status: AC
  Administered 2019-09-01: 19:00:00 0.2 mg via INTRAMUSCULAR

## 2019-09-01 MED ORDER — LACTATED RINGERS IV SOLN: INTRAVENOUS | Status: DC

## 2019-09-01 MED ORDER — TERBUTALINE SULFATE 1 MG/ML IJ SOLN: 0.2500 mg | Freq: Once | INTRAMUSCULAR | Status: DC | PRN

## 2019-09-01 MED ORDER — DIBUCAINE (PERIANAL) 1 % EX OINT: 1.0000 "application " | TOPICAL_OINTMENT | CUTANEOUS | Status: DC | PRN

## 2019-09-01 MED ORDER — BUTORPHANOL TARTRATE 1 MG/ML IJ SOLN: 1.0000 mg | INTRAMUSCULAR | Status: DC | PRN

## 2019-09-01 MED ORDER — MISOPROSTOL 200 MCG PO TABS: 800.0000 ug | ORAL_TABLET | Freq: Once | ORAL | Status: AC

## 2019-09-01 MED ORDER — SIMETHICONE 80 MG PO CHEW: 80.0000 mg | CHEWABLE_TABLET | ORAL | Status: DC | PRN

## 2019-09-01 MED ORDER — LACTATED RINGERS IV SOLN
500.0000 mL | INTRAVENOUS | Status: DC | PRN
  Administered 2019-09-01: 12:00:00 500 mL via INTRAVENOUS

## 2019-09-01 MED ORDER — LIDOCAINE HCL (PF) 1 % IJ SOLN
INTRAMUSCULAR | Status: AC
  Filled 2019-09-01: qty 30

## 2019-09-01 MED ORDER — LIDOCAINE-EPINEPHRINE (PF) 1.5 %-1:200000 IJ SOLN
INTRAMUSCULAR | Status: DC | PRN
  Administered 2019-09-01: 3 mL via EPIDURAL

## 2019-09-01 MED ORDER — PRENATAL MULTIVITAMIN CH
1.0000 | ORAL_TABLET | Freq: Every day | ORAL | Status: DC
  Administered 2019-09-02: 12:00:00 1 via ORAL
  Filled 2019-09-01: qty 1

## 2019-09-01 MED ORDER — ONDANSETRON HCL 4 MG/2ML IJ SOLN: 4.0000 mg | Freq: Four times a day (QID) | INTRAMUSCULAR | Status: DC | PRN

## 2019-09-01 MED ORDER — OXYTOCIN 40 UNITS IN NORMAL SALINE INFUSION - SIMPLE MED
2.5000 [IU]/h | INTRAVENOUS | Status: DC
  Filled 2019-09-01: qty 1000

## 2019-09-01 MED ORDER — ONDANSETRON HCL 4 MG PO TABS: 4.0000 mg | ORAL_TABLET | ORAL | Status: DC | PRN

## 2019-09-01 MED ORDER — OXYTOCIN BOLUS FROM INFUSION
500.0000 mL | Freq: Once | INTRAVENOUS | Status: AC
  Administered 2019-09-01: 19:00:00 500 mL via INTRAVENOUS

## 2019-09-01 MED ORDER — BUPIVACAINE HCL (PF) 0.25 % IJ SOLN
INTRAMUSCULAR | Status: DC | PRN
  Administered 2019-09-01: 4 mL via EPIDURAL
  Administered 2019-09-01: 3 mL via EPIDURAL

## 2019-09-01 MED ORDER — MISOPROSTOL 25 MCG QUARTER TABLET
25.0000 ug | ORAL_TABLET | ORAL | Status: DC | PRN
  Administered 2019-09-01 (×2): 25 ug via VAGINAL
  Filled 2019-09-01 (×2): qty 1

## 2019-09-01 MED ORDER — SENNOSIDES-DOCUSATE SODIUM 8.6-50 MG PO TABS
2.0000 | ORAL_TABLET | ORAL | Status: DC
  Administered 2019-09-02: 09:00:00 2 via ORAL
  Filled 2019-09-01: qty 2

## 2019-09-01 MED ORDER — ACETAMINOPHEN 325 MG PO TABS: 650.0000 mg | ORAL_TABLET | ORAL | Status: DC | PRN

## 2019-09-01 MED ORDER — BENZOCAINE-MENTHOL 20-0.5 % EX AERO: 1.0000 "application " | INHALATION_SPRAY | CUTANEOUS | Status: DC | PRN

## 2019-09-01 MED ORDER — WITCH HAZEL-GLYCERIN EX PADS: 1.0000 "application " | MEDICATED_PAD | CUTANEOUS | Status: DC | PRN

## 2019-09-01 MED ORDER — OXYTOCIN 40 UNITS IN NORMAL SALINE INFUSION - SIMPLE MED
1.0000 m[IU]/min | INTRAVENOUS | Status: DC
  Administered 2019-09-01: 10:00:00 2 m[IU]/min via INTRAVENOUS

## 2019-09-01 MED ORDER — FENTANYL 2.5 MCG/ML W/ROPIVACAINE 0.15% IN NS 100 ML EPIDURAL (ARMC)
EPIDURAL | Status: DC | PRN
  Administered 2019-09-01: 12 mL/h via EPIDURAL

## 2019-09-01 MED ORDER — AMMONIA AROMATIC IN INHA
RESPIRATORY_TRACT | Status: AC
  Filled 2019-09-01: qty 10

## 2019-09-01 MED ORDER — FENTANYL 2.5 MCG/ML W/ROPIVACAINE 0.15% IN NS 100 ML EPIDURAL (ARMC)
EPIDURAL | Status: AC
  Filled 2019-09-01: qty 100

## 2019-09-01 MED ORDER — FERROUS SULFATE 325 (65 FE) MG PO TABS
325.0000 mg | ORAL_TABLET | Freq: Every day | ORAL | Status: DC
  Administered 2019-09-02: 09:00:00 325 mg via ORAL
  Filled 2019-09-01: qty 1

## 2019-09-01 MED ORDER — COCONUT OIL OIL
1.0000 "application " | TOPICAL_OIL | Status: DC | PRN
  Filled 2019-09-01: qty 120

## 2019-09-01 MED ORDER — LIDOCAINE HCL (PF) 1 % IJ SOLN
INTRAMUSCULAR | Status: DC | PRN
  Administered 2019-09-01: 3 mL via SUBCUTANEOUS

## 2019-09-01 MED ORDER — METHYLERGONOVINE MALEATE 0.2 MG/ML IJ SOLN
INTRAMUSCULAR | Status: AC
  Filled 2019-09-01: qty 1

## 2019-09-01 MED ORDER — MISOPROSTOL 200 MCG PO TABS
ORAL_TABLET | ORAL | Status: AC
  Administered 2019-09-01: 19:00:00 800 ug via RECTAL
  Filled 2019-09-01: qty 4

## 2019-09-01 NOTE — Anesthesia Preprocedure Evaluation (Signed)
Anesthesia Evaluation  Patient identified by MRN, date of birth, ID band Patient awake    Reviewed: Allergy & Precautions, H&P , NPO status , Patient's Chart, lab work & pertinent test results  Airway Mallampati: II  TM Distance: >3 FB Neck ROM: full    Dental  (+) Teeth Intact   Pulmonary former smoker,    Pulmonary exam normal        Cardiovascular Normal cardiovascular exam     Neuro/Psych negative neurological ROS     GI/Hepatic negative GI ROS, Neg liver ROS,   Endo/Other  negative endocrine ROS  Renal/GU negative Renal ROS  negative genitourinary   Musculoskeletal   Abdominal   Peds  Hematology  (+) Blood dyscrasia, anemia ,   Anesthesia Other Findings   Reproductive/Obstetrics (+) Pregnancy                             Anesthesia Physical Anesthesia Plan  ASA: II  Anesthesia Plan: Epidural   Post-op Pain Management:  Regional for Post-op pain   Induction:   PONV Risk Score and Plan:   Airway Management Planned:   Additional Equipment:   Intra-op Plan:   Post-operative Plan:   Informed Consent: I have reviewed the patients History and Physical, chart, labs and discussed the procedure including the risks, benefits and alternatives for the proposed anesthesia with the patient or authorized representative who has indicated his/her understanding and acceptance.     Dental Advisory Given  Plan Discussed with: Anesthesiologist and CRNA  Anesthesia Plan Comments:         Anesthesia Quick Evaluation

## 2019-09-01 NOTE — Anesthesia Procedure Notes (Signed)
Epidural Patient location during procedure: OB Start time: 09/01/2019 12:00 PM End time: 09/01/2019 12:10 PM  Staffing Anesthesiologist: Corinda Gubler, MD Resident/CRNA: Karoline Caldwell, CRNA Performed: resident/CRNA   Preanesthetic Checklist Completed: patient identified, IV checked, site marked, risks and benefits discussed, surgical consent, monitors and equipment checked, pre-op evaluation and timeout performed  Epidural Patient position: sitting Prep: ChloraPrep Patient monitoring: heart rate, continuous pulse ox and blood pressure Approach: midline Location: L3-L4 Injection technique: LOR air  Needle:  Needle type: Tuohy  Needle gauge: 17 G Needle length: 9 cm and 9 Needle insertion depth: 7 cm Catheter type: closed end flexible Catheter size: 19 Gauge Catheter at skin depth: 12 cm Test dose: negative and 1.5% lidocaine with Epi 1:200 K  Assessment Events: blood not aspirated, injection not painful, no injection resistance, no paresthesia and negative IV test  Additional Notes 1 attempt Pt. Evaluated and documentation done after procedure finished. Patient identified. Risks/Benefits/Options discussed with patient including but not limited to bleeding, infection, nerve damage, paralysis, failed block, incomplete pain control, headache, blood pressure changes, nausea, vomiting, reactions to medication both or allergic, itching and postpartum back pain. Confirmed with bedside nurse the patient's most recent platelet count. Confirmed with patient that they are not currently taking any anticoagulation, have any bleeding history or any family history of bleeding disorders. Patient expressed understanding and wished to proceed. All questions were answered. Sterile technique was used throughout the entire procedure. Please see nursing notes for vital signs. Test dose was given through epidural catheter and negative prior to continuing to dose epidural or start infusion. Warning signs of  high block given to the patient including shortness of breath, tingling/numbness in hands, complete motor block, or any concerning symptoms with instructions to call for help. Patient was given instructions on fall risk and not to get out of bed. All questions and concerns addressed with instructions to call with any issues or inadequate analgesia.   Patient tolerated the insertion well without immediate complications.Reason for block:procedure for pain

## 2019-09-01 NOTE — Discharge Summary (Signed)
Physician Obstetric Discharge Summary  Patient ID: AAIRAH NEGRETTE MRN: [id number] DOB/AGE: January 03, 1991 28 y.o.   Date of Admission: 09/01/2019  Date of Discharge:   Admitting Diagnosis: Induction of labor at [redacted]w[redacted]d  Secondary Diagnosis: none  Mode of Delivery: normal spontaneous vaginal delivery 09/01/2019      Discharge Diagnosis: Term intrauterine pregnancy delivered   Intrapartum Procedures: Atificial rupture of membranes, epidural, pitocin augmentation, placement of intrauterine catheter and Cytotec induction, and amnioinfusion   Post partum procedures: none  Complications: none   Brief Hospital Course  Irma C Shabazz is a G2P1001 who had a SVD on 09/01/2019 following an induction of labor at 40wk5d;  for further details of this delivery, please refer to the delivery note.  Patient had an uncomplicated postpartum course.  By time of discharge on PPD#1, her pain was controlled on oral pain medications; she had appropriate lochia and was ambulating, voiding without difficulty and tolerating regular diet.  She was deemed stable for discharge to home.    Labs: CBC Latest Ref Rng & Units 09/02/2019 09/01/2019 06/02/2019  WBC 4.0 - 10.5 K/uL 15.0(H) 12.0(H) 9.2  Hemoglobin 12.0 - 15.0 g/dL 8.6(V) 7.8(I) 6.9(G)  Hematocrit 36.0 - 46.0 % 29.3(L) 30.7(L) 29.1(L)  Platelets 150 - 400 K/uL 318 349 297   A POS Performed at [facility], [address] [place], [place] [address]   Physical exam:  Blood pressure 102/75, pulse 84, temperature 98.5 F (36.9 C), temperature source Oral, resp. rate 18, height 5\' 6"  (1.676 m), weight 85.7 kg, last menstrual period 11/20/2018, SpO2 99 %, unknown if currently breastfeeding. General: alert and no distress Lochia: appropriate Abdomen: soft, NT Uterine Fundus: firm Extremities: No evidence of DVT seen on physical exam. No lower extremity edema.  Discharge Instructions: Per After Visit Summary. Activity: Advance as tolerated. Pelvic  rest for 6 weeks.  Also refer to Discharge Instructions Diet: Regular Medications: Allergies as of 09/02/2019   No Known Allergies     Medication List    TAKE these medications   ibuprofen 600 MG tablet Commonly known as: ADVIL Take 1 tablet (600 mg total) by mouth every 6 (six) hours.   multivitamin-prenatal 27-0.8 MG Tabs tablet Take 1 tablet by mouth daily at 12 noon.   norethindrone 0.35 MG tablet Commonly known as: MICRONOR Take 1 tablet (0.35 mg total) by mouth daily.      Outpatient follow up:  Follow-up Information    11/02/2019, CNM. Schedule an appointment as soon as possible for a visit.   Specialty: Certified Nurse Midwife Why: for a 6 week postpartum check Contact information: [address] [place] [id number]          Postpartum contraception: undecided  Discharged Condition: good  Discharged to: home   Newborn Data: Disposition:home with mother  Apgars: APGAR (1 MIN): 8   APGAR (5 MINS): 9   APGAR (10 MINS):    Baby Feeding: Bottle  [phone], MD 09/02/2019 1:06 PM

## 2019-09-01 NOTE — Progress Notes (Signed)
L&D Progress Note   29 year old G2 P1001 presented this AM for IOL at 40wk5d. Received 2 doses of Cytotec (last dose 0455). GBS negative. Pelvis proven to 7#12oz   S: Rates contraction pain at a 2/10  O: BP (!) 94/50 (BP Location: Right Arm)   Pulse 92   Temp 99.1 F (37.3 C) (Oral)   Resp 18   Ht 5\' 6"  (1.676 m)   Wt 85.7 kg   LMP 11/20/2018 (Exact Date)   BMI 30.51 kg/m    General: gravid WF in NAD  FHR: currently 130 baseline with accelerations and moderate variability Had four repetitive late/variable decelerations to nadirs of 60s/90s/and 100,  after returning from the BR when lying on her back. The pattern quickly resolved with position change to her right side.  Toco: contractions every 2-3 minutes apart  Cervix: tight 3cm/60-70%/-1/ vertex  A: IOL in progress Currently Cat1 Episode of FHR decelerations-resolved with position change  P: Pitocin induction/augmentation Epidural when desires   03-14-1984, CNM

## 2019-09-01 NOTE — Progress Notes (Signed)
L&D Progress Note    S: Comfortable after epidural.  O: Vital signs: BP 122/88   Pulse (!) 111   Temp 98.8 F (37.1 C) (Oral)   Resp 18   Ht 5\' 6"  (1.676 m)   Wt 85.7 kg   LMP 11/20/2018 (Exact Date)   SpO2 100%   BMI 30.51 kg/m   General: appears comfortable  FHR: baseline 135 with accelerations, moderate variability. Late decelerations when supine for catheter insertion or cervical check. Decelerations resolve with turning patient on her side.  Toco: contractions every 3-5 min apart, Pitocin at 7 miu/min  Cervix: 3+/60-70%/ -1   A: Little progress Fetus intolerant of supine position Good pain control with epidural  P: AROM-clear fluid, moderate amount Monitor progress and fetal well being  11/22/2018, CNM

## 2019-09-01 NOTE — H&P (Addendum)
History and Physical Interval Note:  09/01/2019 12:47 AM  Pam Mitchell  has presented today for INDUCTION OF LABOR (cervical ripening agents),  with the diagnosis of post dates pregnancy. The various methods of treatment have been discussed with the patient and family. After consideration of risks, benefits and other options for treatment, the patient has consented to  Labor induction .  The patient's history has been reviewed, patient examined, no change in status, and is stable for induction as planned.  See H&P. I have reviewed the patient's chart and labs.  Questions were answered to the patient's satisfaction.    Annamarie Major, MD, [facility], [facility] 09/01/2019  12:47 AM

## 2019-09-02 ENCOUNTER — Encounter: Payer: Self-pay | Admitting: Obstetrics & Gynecology

## 2019-09-02 LAB — CBC
HCT: 29.3 % — ABNORMAL LOW (ref 36.0–46.0)
Hemoglobin: 9.3 g/dL — ABNORMAL LOW (ref 12.0–15.0)
MCH: 25.5 pg — ABNORMAL LOW (ref 26.0–34.0)
MCHC: 31.7 g/dL (ref 30.0–36.0)
MCV: 80.3 fL (ref 80.0–100.0)
Platelets: 318 10*3/uL (ref 150–400)
RBC: 3.65 MIL/uL — ABNORMAL LOW (ref 3.87–5.11)
RDW: 14.5 % (ref 11.5–15.5)
WBC: 15 10*3/uL — ABNORMAL HIGH (ref 4.0–10.5)
nRBC: 0 % (ref 0.0–0.2)

## 2019-09-02 MED ORDER — VARICELLA VIRUS VACCINE LIVE 1350 PFU/0.5ML IJ SUSR
0.5000 mL | Freq: Once | INTRAMUSCULAR | Status: AC
  Administered 2019-09-02: 0.5 mL via SUBCUTANEOUS
  Filled 2019-09-02: qty 0.5

## 2019-09-02 MED ORDER — IBUPROFEN 600 MG PO TABS: 600.0000 mg | ORAL_TABLET | Freq: Four times a day (QID) | ORAL | 0 refills | Status: DC

## 2019-09-02 MED ORDER — NORETHINDRONE 0.35 MG PO TABS: 1.0000 | ORAL_TABLET | Freq: Every day | ORAL | 11 refills | Status: DC

## 2019-09-02 NOTE — Discharge Instructions (Signed)
Postpartum Baby Blues The postpartum period begins right after the birth of a baby. During this time, there is often a lot of joy and excitement. It is also a time of many changes in the life of the parents. No matter how many times a mother gives birth, each child brings new challenges to the family, including different ways of relating to one another. It is common to have feelings of excitement along with confusing changes in moods, emotions, and thoughts. You may feel happy one minute and sad or stressed the next. These feelings of sadness usually happen in the period right after you have your baby, and they go away within a week or two. This is called the "baby blues." What are the causes? There is no known cause of baby blues. It is likely caused by a combination of factors. However, changes in hormone levels after childbirth are believed to trigger some of the symptoms. Other factors that can play a role in these mood changes include:  Lack of sleep.  Stressful life events, such as poverty, caring for a loved one, or death of a loved one.  Genetics. What are the signs or symptoms? Symptoms of this condition include:  Brief changes in mood, such as going from extreme happiness to sadness.  Decreased concentration.  Difficulty sleeping.  Crying spells and tearfulness.  Loss of appetite.  Irritability.  Anxiety. If the symptoms of baby blues last for more than 2 weeks or become more severe, you may have postpartum depression. How is this diagnosed? This condition is diagnosed based on an evaluation of your symptoms. There are no medical or lab tests that lead to a diagnosis, but there are various questionnaires that a health care provider may use to identify women with the baby blues or postpartum depression. How is this treated? Treatment is not needed for this condition. The baby blues usually go away on their own in 1-2 weeks. Social support is often all that is needed. You will  be encouraged to get adequate sleep and rest. Follow these instructions at home: Lifestyle      Get as much rest as you can. Take a nap when the baby sleeps.  Exercise regularly as told by your health care provider. Some women find yoga and walking to be helpful.  Eat a balanced and nourishing diet. This includes plenty of fruits and vegetables, whole grains, and lean proteins.  Do little things that you enjoy. Have a cup of tea, take a bubble bath, read your favorite magazine, or listen to your favorite music.  Avoid alcohol.  Ask for help with household chores, cooking, grocery shopping, or running errands. Do not try to do everything yourself. Consider hiring a postpartum doula to help. This is a professional who specializes in providing support to new mothers.  Try not to make any major life changes during pregnancy or right after giving birth. This can add stress. General instructions  Talk to people close to you about how you are feeling. Get support from your partner, family members, friends, or other new moms. You may want to join a support group.  Find ways to cope with stress. This may include: ? Writing your thoughts and feelings in a journal. ? Spending time outside. ? Spending time with people who make you laugh.  Try to stay positive in how you think. Think about the things you are grateful for.  Take over-the-counter and prescription medicines only as told by your health care provider.    Let your health care provider know if you have any concerns.  Keep all postpartum visits as told by your health care provider. This is important. Contact a health care provider if:  Your baby blues do not go away after 2 weeks. Get help right away if:  You have thoughts of taking your own life (suicidal thoughts).  You think you may harm the baby or other people.  You see or hear things that are not there (hallucinations). Summary  After giving birth, you may feel happy  one minute and sad or stressed the next. Feelings of sadness that happen right after the baby is born and go away after a week or two are called the "baby blues."  You can manage the baby blues by getting enough rest, eating a healthy diet, exercising, spending time with supportive people, and finding ways to cope with stress.  If feelings of sadness and stress last longer than 2 weeks or get in the way of caring for your baby, talk to your health care provider. This may mean you have postpartum depression. This information is not intended to replace advice given to you by your health care provider. Make sure you discuss any questions you have with your health care provider. Document Revised: 10/02/2018 Document Reviewed: 08/06/2016 Elsevier Patient Education  2020 [employer]  Vaginal Delivery, Care After Refer to this sheet in the next few weeks. These discharge instructions provide you with information on caring for yourself after delivery. Your caregiver may also give you specific instructions. Your treatment has been planned according to the most current medical practices available, but problems sometimes occur. Call your caregiver if you have any problems or questions after you go home. HOME CARE INSTRUCTIONS 4. Take over-the-counter or prescription medicines only as directed by your caregiver or pharmacist. 5. Do not drink alcohol, especially if you are breastfeeding or taking medicine to relieve pain. 6. Do not smoke tobacco. 7. Continue to use good perineal care. Good perineal care includes: 1. Wiping your perineum from back to front 2. Keeping your perineum clean. 3. You can do sitz baths twice a day, to help keep this area clean 8. Do not use tampons, douche or have sex for 6 weeks 9. Shower only and avoid sitting in submerged water, aside from sitz baths 10. Wear a well-fitting bra that provides breast support. 11. Eat healthy foods. 12. Drink enough fluids to keep your urine  clear or pale yellow. 13. Eat high-fiber foods such as whole grain cereals and breads, brown rice, beans, and fresh fruits and vegetables every day. These foods may help prevent or relieve constipation. 14. Avoid constipation with high fiber foods or medications, such as miralax or metamucil 15. Follow your caregiver's recommendations regarding resumption of activities such as climbing stairs, driving, lifting, exercising, or traveling. 16. Talk to your caregiver about resuming sexual activities. Resumption of sexual activities after 6 weeks is dependent upon your risk of infection, your rate of healing, and your comfort and desire to resume sexual activity. 17. Try to have someone help you with your household activities and your newborn for at least a few days after you leave the hospital. 18. Rest as much as possible. Try to rest or take a nap when your newborn is sleeping. 19. Increase your activities gradually. 20. Keep all of your scheduled postpartum appointments. It is very important to keep your scheduled follow-up appointments. At these appointments, your caregiver will be checking to make sure that you are healing  physically and emotionally. SEEK MEDICAL CARE IF:   You are passing large clots from your vagina. Save any clots to show your caregiver.  You have a foul smelling discharge from your vagina.  You have trouble urinating.  You are urinating frequently.  You have pain when you urinate.  You have a change in your bowel movements.  You have increasing redness, pain, or swelling near your vaginal incision (episiotomy) or vaginal tear.  You have pus draining from your episiotomy or vaginal tear.  Your episiotomy or vaginal tear is separating.  You have painful, hard, or reddened breasts.  You have a severe headache.  You have blurred vision or see spots.  You feel sad or depressed.  You have thoughts of hurting yourself or your newborn.  You have questions about  your care, the care of your newborn, or medicines.  You are dizzy or light-headed.  You have a rash.  You have nausea or vomiting.  You were breastfeeding and have not had a menstrual period within 12 weeks after you stopped breastfeeding.  You are not breastfeeding and have not had a menstrual period by the 12th week after delivery.  You have a fever of 100.5 or more SEEK IMMEDIATE MEDICAL CARE IF:   You have persistent pain.  You have chest pain.  You have shortness of breath.  You faint.  You have leg pain.  You have stomach pain.  Your vaginal bleeding saturates two or more sanitary pads in 1 hour. MAKE SURE YOU:   Understand these instructions.  Will watch your condition.  Will get help right away if you are not doing well or get worse. Document Released: 06/07/2000 Document Revised: 10/25/2013 Document Reviewed: 02/05/2012 [facility] Patient Information 2015 [place], [place]. This information is not intended to replace advice given to you by your health care provider. Make sure you discuss any questions you have with your health care provider.  Sitz Bath A sitz bath is a warm water bath taken in the sitting position. The water covers only the hips and butt (buttocks). We recommend using one that fits in the toilet, to help with ease of use and cleanliness. It may be used for either healing or cleaning purposes. Sitz baths are also used to relieve pain, itching, or muscle tightening (spasms). The water may contain medicine. Moist heat will help you heal and relax.  HOME CARE  Take 3 to 4 sitz baths a day. 21. Fill the bathtub half-full with warm water. 22. Sit in the water and open the drain a little. 23. Turn on the warm water to keep the tub half-full. Keep the water running constantly. 24. Soak in the water for 15 to 20 minutes. 25. After the sitz bath, pat the affected area dry. GET HELP RIGHT AWAY IF: You get worse instead of better. Stop the sitz baths if you  get worse. MAKE SURE YOU:  Understand these instructions.  Will watch your condition.  Will get help right away if you are not doing well or get worse. Document Released: 07/18/2004 Document Revised: 03/04/2012 Document Reviewed: 10/08/2010 [facility] Patient Information 2015 [place], [place]. This information is not intended to replace advice given to you by your health care provider. Make sure you discuss any questions you have with your health care provider.

## 2019-09-02 NOTE — Lactation Note (Signed)
This note was copied from a baby's chart. Lactation Consultation Note  Patient Name: Pam Mitchell [id number] Date: 09/02/2019 Reason for consult: Follow-up assessment;Term  LC in to see parents and baby Pam Mitchell. Feedings have continued to go well per mom. Baby was upset after bath and mom with LC assistance attempted latching Pam Mitchell for almost 15 minutes before he fell asleep; no latch achieved.  Reviewed breastfeeding basics, expectations of newborn feeding patterns, output expectations, and growth spurts/cluster feeding. Discussed normal course of lactation, skin to skin, milk supply and demand. Provided information for outpatient support and breastfeeding support groups.  Maternal Data Formula Feeding for Exclusion: No Has patient been taught Hand Expression?: Yes Does the patient have breastfeeding experience prior to this delivery?: Yes  Feeding Feeding Type: Breast Fed  LATCH Score                   Interventions Interventions: Breast feeding basics reviewed;Assisted with latch;Hand express  Lactation Tools Discussed/Used     Consult Status Consult Status: PRN    Danford Bad 09/02/2019, 3:27 PM

## 2019-09-02 NOTE — Anesthesia Postprocedure Evaluation (Signed)
Anesthesia Post Note  Patient: Pam Mitchell  Procedure(s) Performed: AN AD HOC LABOR EPIDURAL  Patient location during evaluation: Mother Baby Anesthesia Type: Epidural Level of consciousness: awake and alert Pain management: pain level controlled Vital Signs Assessment: post-procedure vital signs reviewed and stable Respiratory status: spontaneous breathing, nonlabored ventilation and respiratory function stable Cardiovascular status: stable Postop Assessment: no headache, no backache and epidural receding Anesthetic complications: no     Last Vitals:  Vitals:   09/01/19 2244 09/02/19 0401  BP: 112/80 104/68  Pulse: 88 84  Resp: 18 18  Temp: 37.1 C 36.9 C  SpO2: 100% 99%    Last Pain:  Vitals:   09/02/19 0401  TempSrc: Oral  PainSc:                  Elmarie Mainland

## 2019-09-02 NOTE — Lactation Note (Signed)
This note was copied from a baby's chart. Lactation Consultation Note  Patient Name: Boy Gredmarie Niehoff Today's Date: 09/02/2019 Reason for consult: Initial assessment  Support person holding child and child asleep when [facility] student entered room.   MOB reported that feedings were going well, no pain or discomfort. MOB reported hx of breastfeeding with 27yo daughter, ceased because (older) child was dissatisfied after feeds and began supplementing with formula.   [facility] student provided education on normal newborn behavior, cluster feeding, feeding at cues.  MOB knows to contact Lactation for assistance, PRN.    Maternal Data Formula Feeding for Exclusion: No Has patient been taught Hand Expression?: No Does the patient have breastfeeding experience prior to this delivery?: Yes  Feeding    LATCH Score                   Interventions    Lactation Tools Discussed/Used     Consult Status      Carlena Hurl 09/02/2019, 10:03 AM

## 2019-09-14 ENCOUNTER — Ambulatory Visit: Payer: Self-pay

## 2019-09-14 NOTE — Lactation Note (Deleted)
This note was copied from a baby's chart. Lactation Consultation Note  Patient Name: Pam Mitchell Today's Date: 09/14/2019    Lactation student called MOB to follow up regarding how breastfeeding is going. MOB reported that at pediatrician on 3/15, she was instructed to supplement due to "a little" weight loss.   MOB reports all questions answered at this time and that "it's working now".  MOB confirmed that she has contact for lactation and will call, PRN.   Carlena Hurl 09/14/2019, 10:42 AM

## 2019-10-28 ENCOUNTER — Encounter: Payer: Self-pay | Admitting: Certified Nurse Midwife

## 2019-10-28 NOTE — Progress Notes (Signed)
Postpartum Visit  Chief Complaint:  Chief Complaint  Patient presents with  . Postpartum Care    Vaginal delivery 3/10    History of Present Illness: Pam Mitchell is a 29 y.o. [id number] who presents for her 6 week postpartum visit (actually 8 weeks).  Date of delivery: 09/01/2019 Type of delivery: Vaginal delivery - Vacuum or forceps assisted  no Episiotomy No.  Laceration: no  Pregnancy or labor problems:  Yes, anemia, variable/late decelerations/ uterine atony Any problems since the delivery:  no  Newborn Details:  SINGLETON :  1. Baby's name: Gareth Eagle. Birth weight: 9#0.6oz Maternal Details:  Breast Feeding:  no Post partum depression/anxiety noted:  no Edinburgh Post-Partum Depression Score:  1  Date of last PAP: 01/25/2019  ASCUS/ negative HRHPV   Review of Systems: ROS  Past Medical History:  History reviewed. No pertinent past medical history.  Past Surgical History:  Past Surgical History:  Procedure Laterality Date  . TONSILLECTOMY AND ADENOIDECTOMY      Family History:  Family History  Problem Relation Age of Onset  . Ovarian cancer Paternal Grandmother     Social History:  Social History   Socioeconomic History  . Marital status: Married    Spouse name: Not on file  . Number of children: 2  . Years of education: Not on file  . Highest education level: Not on file  Occupational History  . Not on file  Tobacco Use  . Smoking status: Former Research scientist (life sciences)  . Smokeless tobacco: Never Used  Substance and Sexual Activity  . Alcohol use: Not Currently  . Drug use: Not Currently  . Sexual activity: Yes    Partners: Male    Birth control/protection: Pill  Other Topics Concern  . Not on file  Social History Narrative  . Not on file   Social Determinants of Health   Financial Resource Strain:   . Difficulty of Paying Living Expenses:   Food Insecurity:   . Worried About Charity fundraiser in the Last Year:   . Arboriculturist in the Last Year:    Transportation Needs:   . Film/video editor (Medical):   Marland Kitchen Lack of Transportation (Non-Medical):   Physical Activity:   . Days of Exercise per Week:   . Minutes of Exercise per Session:   Stress:   . Feeling of Stress :   Social Connections:   . Frequency of Communication with Friends and Family:   . Frequency of Social Gatherings with Friends and Family:   . Attends Religious Services:   . Active Member of Clubs or Organizations:   . Attends Archivist Meetings:   Marland Kitchen Marital Status:   Intimate Partner Violence:   . Fear of Current or Ex-Partner:   . Emotionally Abused:   Marland Kitchen Physically Abused:   . Sexually Abused:     Allergies:  No Known Allergies  Medications: Prior to Admission medications   Medication Sig Start Date End Date Taking? Authorizing Provider  ibuprofen (ADVIL) 600 MG tablet Take 1 tablet (600 mg total) by mouth every 6 (six) hours. 09/02/19   Malachy Mood, MD  norethindrone (MICRONOR) 0.35 MG tablet Take 1 tablet (0.35 mg total) by mouth daily. 09/02/19   Malachy Mood, MD           Physical Exam Vitals: BP (!) 98/56   Pulse 75   Ht 5\' 6"  (1.676 m)   Wt 156 lb (70.8 kg)   Breastfeeding  No   BMI 25.18 kg/m  General: WF in NAD HEENT: normocephalic, anicteric Neck: No thyroid enlargement, no palpable nodules, no cervical lymphadenpathy Breast:  no inflammation, no masses, nipples intact Pulmonary: No increased work of breathing, CTAB Heart: RRR without murmur Abdomen: Soft, non-tender, non-distended.  Umbilicus without lesions.  No hepatomegaly or masses palpable. No evidence of hernia. Genitourinary:  External: Well healed perineum, no lesions or inflammation    Vagina: Normal vaginal mucosa, no evidence of prolapse.    Cervix:  Closed, no CMT  Uterus: AV, well involuted, mobile, non-tender  Adnexa: No adnexal masses, non-tender  Rectal: deferred Extremities: no edema, erythema, or tenderness Neurologic: Grossly  intact Psychiatric: mood appropriate, affect full Edinburgh Postnatal Depression Scale - 10/29/19 1138      Edinburgh Postnatal Depression Scale:  In the Past 7 Days   I have been able to laugh and see the funny side of things.  0    I have looked forward with enjoyment to things.  0    I have blamed myself unnecessarily when things went wrong.  1    I have been anxious or worried for no good reason.  0    I have felt scared or panicky for no good reason.  0    Things have been getting on top of me.  0    I have been so unhappy that I have had difficulty sleeping.  0    I have felt sad or miserable.  0    I have been so unhappy that I have been crying.  0    The thought of harming myself has occurred to me.  0    Edinburgh Postnatal Depression Scale Total  1      Assessment: 29 y.o. [id number] presenting for 8 week postpartum visit-normal involution  Plan:  1) Contraception: currently taking minipill. Since no longer breast feeding discussed changing to OCPs for increased contraceptive effectiveness and better cycle control and she agrees.  Will have her change to Junel 24 after completing current pack of pills.   2)  Pap due after 01/25/20 (follow up on ASCUS Pap) and for follow up on pills  3) Patient underwent screening for postpartum depression with no concerns noted.  4) Discussed return to normal activity, recommend continuing prenatal vitamins.  5) Offer MYRISK testing due to family hx of ovarian cancer when she returns for follow up.  Farrel Conners, CNM

## 2019-10-29 ENCOUNTER — Encounter: Payer: Self-pay | Admitting: Certified Nurse Midwife

## 2019-10-29 ENCOUNTER — Other Ambulatory Visit: Payer: Self-pay

## 2019-10-29 ENCOUNTER — Ambulatory Visit (INDEPENDENT_AMBULATORY_CARE_PROVIDER_SITE_OTHER): Payer: Managed Care, Other (non HMO) | Admitting: Certified Nurse Midwife

## 2019-10-29 DIAGNOSIS — Z3041 Encounter for surveillance of contraceptive pills: Secondary | ICD-10-CM

## 2019-10-30 ENCOUNTER — Encounter: Payer: Self-pay | Admitting: Certified Nurse Midwife

## 2019-10-30 MED ORDER — JUNEL FE 24 1-20 MG-MCG(24) PO TABS: 1.0000 | ORAL_TABLET | Freq: Every day | ORAL | 11 refills | Status: DC

## 2020-02-01 ENCOUNTER — Ambulatory Visit: Payer: Self-pay | Admitting: Certified Nurse Midwife

## 2020-09-13 ENCOUNTER — Telehealth: Payer: Self-pay

## 2020-09-13 MED ORDER — JUNEL FE 24 1-20 MG-MCG(24) PO TABS: 1.0000 | ORAL_TABLET | Freq: Every day | ORAL | 0 refills | Status: DC

## 2020-09-13 NOTE — Telephone Encounter (Signed)
Patient is scheduled 09/29/20 with MMF for annual

## 2020-09-13 NOTE — Telephone Encounter (Signed)
Patient coming in on 09/29/2020 at 2:30 to see MMF for annual appt.

## 2020-09-13 NOTE — Telephone Encounter (Signed)
Pt's pharmacy, [facility], sent refill request for Blisovi 24 FE.  No appt has been made.  Will send to front desk for scheduling and ask them to send it back to me to eRx.

## 2020-09-13 NOTE — Telephone Encounter (Signed)
Pt aware refill eRx'd. 

## 2020-09-29 ENCOUNTER — Ambulatory Visit: Payer: Managed Care, Other (non HMO) | Admitting: Obstetrics

## 2020-11-14 ENCOUNTER — Other Ambulatory Visit: Payer: Self-pay

## 2020-11-14 ENCOUNTER — Other Ambulatory Visit (HOSPITAL_COMMUNITY)
Admission: RE | Admit: 2020-11-14 | Discharge: 2020-11-14 | Disposition: A | Discharge status: Home / Self Care | Payer: Managed Care, Other (non HMO) | Source: Ambulatory Visit | Attending: Obstetrics and Gynecology | Admitting: Obstetrics and Gynecology

## 2020-11-14 ENCOUNTER — Encounter: Payer: Self-pay | Admitting: Obstetrics and Gynecology

## 2020-11-14 ENCOUNTER — Ambulatory Visit (INDEPENDENT_AMBULATORY_CARE_PROVIDER_SITE_OTHER): Payer: No Typology Code available for payment source | Admitting: Obstetrics and Gynecology

## 2020-11-14 VITALS — BP 98/62 | HR 75 | Ht 66.0 in | Wt 157.0 lb

## 2020-11-14 DIAGNOSIS — Z0000 Encounter for general adult medical examination without abnormal findings: Secondary | ICD-10-CM

## 2020-11-14 DIAGNOSIS — Z124 Encounter for screening for malignant neoplasm of cervix: Secondary | ICD-10-CM | POA: Diagnosis not present

## 2020-11-14 DIAGNOSIS — Z01419 Encounter for gynecological examination (general) (routine) without abnormal findings: Secondary | ICD-10-CM

## 2020-11-14 DIAGNOSIS — Z3041 Encounter for surveillance of contraceptive pills: Secondary | ICD-10-CM

## 2020-11-14 MED ORDER — JUNEL FE 24 1-20 MG-MCG(24) PO TABS: 1.0000 | ORAL_TABLET | Freq: Every day | ORAL | 0 refills | Status: DC

## 2020-11-14 NOTE — Progress Notes (Signed)
Gynecology Annual Exam   PCP: Patient, No Pcp Per (Inactive)  Chief Complaint:  Chief Complaint  Patient presents with  . Annual Exam   History of Present Illness: Patient is a 30 y.o. G2P2002 presents for annual exam. The patient has no complaints today.   LMP: Patient's last menstrual period was 10/24/2020 (approximate). Average Interval: regular, 28 days Duration of flow: 5-6 days Heavy Menses: no Clots: no Intermenstrual Bleeding: no Postcoital Bleeding: no Dysmenorrhea: no  The patient is sexually active. She currently uses OCP (estrogen/progesterone) for contraception. She denies dyspareunia. There is no notable family history of breast or ovarian cancer in her family.  The patient wears seatbelts: yes.   The patient has regular exercise: yes.    The patient denies current symptoms of depression.    Review of Systems: Review of Systems  Constitutional: Negative.   HENT: Negative.   Eyes: Negative.   Respiratory: Negative.   Cardiovascular: Negative.   Gastrointestinal: Negative.   Genitourinary: Negative.   Musculoskeletal: Negative.   Skin: Negative.   Neurological: Negative.   Endo/Heme/Allergies: Positive for environmental allergies.  Psychiatric/Behavioral: Negative.     Past Medical History:  Patient Active Problem List   Diagnosis Date Noted  . Normal vaginal delivery 09/01/2019  . Postpartum care following vaginal delivery 09/01/2019  . Anemia during pregnancy in third trimester 06/16/2019    Past Surgical History:  Past Surgical History:  Procedure Laterality Date  . TONSILLECTOMY AND ADENOIDECTOMY      Gynecologic History:  Patient's last menstrual period was 10/24/2020 (approximate). Contraception: OCP (estrogen/progesterone) Last Pap: Results were: ASCUS with NEGATIVE high risk HPV   Obstetric History: [id number]  Family History:  Family History  Problem Relation Age of Onset  . Ovarian cancer Paternal Grandmother     Social  History:  Social History   Socioeconomic History  . Marital status: Married    Spouse name: Not on file  . Number of children: 2  . Years of education: Not on file  . Highest education level: Not on file  Occupational History  . Not on file  Tobacco Use  . Smoking status: Former Games developer  . Smokeless tobacco: Never Used  Vaping Use  . Vaping Use: Never used  Substance and Sexual Activity  . Alcohol use: Not Currently  . Drug use: Not Currently  . Sexual activity: Yes    Partners: Male    Birth control/protection: Pill  Other Topics Concern  . Not on file  Social History Narrative  . Not on file   Social Determinants of Health   Financial Resource Strain: Not on file  Food Insecurity: Not on file  Transportation Needs: Not on file  Physical Activity: Not on file  Stress: Not on file  Social Connections: Not on file  Intimate Partner Violence: Not on file    Allergies:  No Known Allergies  Medications: Prior to Admission medications   Medication Sig Start Date End Date Taking? Authorizing Provider  norethindrone (MICRONOR) 0.35 MG tablet Take 1 tablet (0.35 mg total) by mouth daily. 09/02/19   Vena Austria, MD  Norethindrone Acetate-Ethinyl Estrad-FE (JUNEL FE 24) 1-20 MG-MCG(24) tablet Take 1 tablet by mouth daily. 11/14/20   Zipporah Plants, CNM  Prenatal Vit-Fe Fumarate-FA (MULTIVITAMIN-PRENATAL) 27-0.8 MG TABS tablet Take 1 tablet by mouth daily at 12 noon. Patient not taking: Reported on 11/14/2020    [provider]    Physical Exam Vitals: Blood pressure 98/62, pulse 75, height 5\' 6"  (1.676  m), weight 157 lb (71.2 kg), last menstrual period 10/24/2020, not currently breastfeeding.  General: NAD HEENT: normocephalic, anicteric Thyroid: no enlargement, no palpable nodules Pulmonary: No increased work of breathing, CTAB Cardiovascular: RRR, distal pulses 2+ Breast: Breast symmetrical, no tenderness, no palpable nodules or masses, no skin or nipple  retraction present, no nipple discharge.  No axillary or supraclavicular lymphadenopathy. Abdomen: NABS, soft, non-tender, non-distended.  Umbilicus without lesions.  No hepatomegaly, splenomegaly or masses palpable. No evidence of hernia  Genitourinary:  External: Normal external female genitalia.  Normal urethral meatus, normal Bartholin's and Skene's glands.    Vagina: Normal vaginal mucosa, no evidence of prolapse.    Cervix: Grossly normal in appearance, no bleeding  Bimanual: deferred  Rectal: deferred  Lymphatic: no evidence of inguinal lymphadenopathy Extremities: no edema, erythema, or tenderness Neurologic: Grossly intact Psychiatric: mood appropriate, affect full  Assessment: 30 y.o. G2P2002 routine annual exam  Plan:    Visit Diagnoses    Encounter for gynecological examination without abnormal finding    -  Primary   Routine health maintenance       Screening for cervical cancer       Relevant Orders   Cytology - PAP   Encounter for surveillance of contraceptive pills       Relevant Medications   Norethindrone Acetate-Ethinyl Estrad-FE (JUNEL FE 24) 1-20 MG-MCG(24) tablet     1) STI screening  was offered and declined  2)  ASCCP guidelines and rational discussed.  Patient opts for yearly screening interval  3) Contraception - the patient is currently using  OCP (estrogen/progesterone).  She is happy with her current form of contraception and plans to continue  4) Routine healthcare maintenance including cholesterol, diabetes screening discussed - To return fasting at a later date  5) Return in about 1 year (around 11/14/2021), or if symptoms worsen or fail to improve.   Zipporah Plants, CNM, MSN [facility], [facility] 11/14/2020, 2:39 PM

## 2020-11-16 LAB — CYTOLOGY - PAP: Diagnosis: NEGATIVE

## 2021-01-16 ENCOUNTER — Other Ambulatory Visit: Payer: Self-pay | Admitting: Obstetrics

## 2021-01-16 DIAGNOSIS — Z3041 Encounter for surveillance of contraceptive pills: Secondary | ICD-10-CM

## 2021-06-08 ENCOUNTER — Encounter: Payer: Self-pay | Admitting: Obstetrics and Gynecology

## 2021-06-08 ENCOUNTER — Ambulatory Visit (INDEPENDENT_AMBULATORY_CARE_PROVIDER_SITE_OTHER): Payer: No Typology Code available for payment source | Admitting: Obstetrics and Gynecology

## 2021-06-08 ENCOUNTER — Other Ambulatory Visit: Payer: Self-pay

## 2021-06-08 VITALS — BP 114/70 | Ht 66.0 in | Wt 154.8 lb

## 2021-06-08 DIAGNOSIS — Z3009 Encounter for other general counseling and advice on contraception: Secondary | ICD-10-CM

## 2021-06-08 NOTE — Progress Notes (Signed)
Patient ID: Pam Mitchell, female   DOB: 1990-11-09, 30 y.o.   MRN: [id number]  Reason for Consult: Gynecologic Exam   Referred by No ref. provider found  Subjective:     HPI:  Pam Mitchell is a 30 y.o. female She presents today for consultation regarding salpingectomy for sterilization.  She reports that she is certain that she is done with childbearing.  She currently has 2 children and does not desire more.  She is currently using oral contraceptive pills and would like to transition to a permanent method.  Gynecological History  No LMP recorded. (Menstrual status: Oral contraceptives).  Past Medical History:  Diagnosis Date   Encounter for induction of labor 09/01/2019   Supervision of other normal pregnancy, antepartum 02/01/2019   [facility] Prenatal Labs Dating L=11 Blood type: A/Positive/-- (08/03 1518)  Genetic Screen Declined 8/18 Antibody:Negative (08/03 1518) Anatomic [place] Complete Rubella: 3.09 (08/03 1518) Varicella: Non-immune GTT  Third trimester:WNL  RPR: Non Reactive (08/03 1518)  Rhogam N/A HBsAg: Negative (08/03 1518)  TDaP vaccine  07/02/19   Flu Shot: 10/14 HIV: Non Reactive (08/03 1518)  Baby Food Breast     Family History  Problem Relation Age of Onset   Ovarian cancer Paternal Grandmother    Past Surgical History:  Procedure Laterality Date   TONSILLECTOMY AND ADENOIDECTOMY      Short Social History:  Social History   Tobacco Use   Smoking status: Former   Smokeless tobacco: Never  Substance Use Topics   Alcohol use: Not Currently    No Known Allergies  Current Outpatient Medications  Medication Sig Dispense Refill   Norethindrone Acetate-Ethinyl Estrad-FE (HAILEY 24 FE) 1-20 MG-MCG(24) tablet TAKE 1 TABLET BY MOUTH EVERY DAY 28 tablet 10   Prenatal Vit-Fe Fumarate-FA (MULTIVITAMIN-PRENATAL) 27-0.8 MG TABS tablet Take 1 tablet by mouth daily at 12 noon. (Patient not taking: Reported on 11/14/2020)     No current facility-administered  medications for this visit.    Review of Systems  Constitutional: Negative for chills, fatigue, fever and unexpected weight change.  HENT: Negative for trouble swallowing.  Eyes: Negative for loss of vision.  Respiratory: Negative for cough, shortness of breath and wheezing.  Cardiovascular: Negative for chest pain, leg swelling, palpitations and syncope.  GI: Negative for abdominal pain, blood in stool, diarrhea, nausea and vomiting.  GU: Negative for difficulty urinating, dysuria, frequency and hematuria.  Musculoskeletal: Negative for back pain, leg pain and joint pain.  Skin: Negative for rash.  Neurological: Negative for dizziness, headaches, light-headedness, numbness and seizures.  Psychiatric: Negative for behavioral problem, confusion, depressed mood and sleep disturbance.       Objective:  Objective   Vitals:   06/08/21 1502  BP: 114/70  Weight: 154 lb 12.8 oz (70.2 kg)  Height: 5\' 6"  (1.676 m)   Body mass index is 24.99 kg/m.  Physical Exam Vitals and nursing note reviewed. Exam conducted with a chaperone present.  Constitutional:      Appearance: Normal appearance.  HENT:     Head: Normocephalic and atraumatic.  Eyes:     Extraocular Movements: Extraocular movements intact.     Pupils: Pupils are equal, round, and reactive to light.  Cardiovascular:     Rate and Rhythm: Normal rate and regular rhythm.  Pulmonary:     Effort: Pulmonary effort is normal.     Breath sounds: Normal breath sounds.  Abdominal:     General: Abdomen is flat.     Palpations: Abdomen  is soft.  Musculoskeletal:     Cervical back: Normal range of motion.  Skin:    General: Skin is warm and dry.  Neurological:     General: No focal deficit present.     Mental Status: She is alert and oriented to person, place, and time.  Psychiatric:        Behavior: Behavior normal.        Thought Content: Thought content normal.        Judgment: Judgment normal.    Assessment/Plan:      30 y.o. [id number]  Patient reports that she desires sterilization. She feels strongly that she does not desire children in the future.   We discussed alternative options for sterilization including long-acting reversible contraception methods. We discussed that sterilization procedures have a failure rate of approximately 1 in  1000.  We discussed potential surgical complications of sterile sterilization including infection damage to surrounding pelvic tissues and risk of bleeding.We discussed that sterilization procedure should be considered on reversible.  Having a reversal surgery to correct a tubal ligation is expensive, risks and ectopic pregnancy, and has high failure rates.  We discussed options of performing a tubal ligation including removing a segment of the fallopian tube or removing an entire fallopian tube.  We discussed that removal of entire fallopian tube has been associated with a reduction in ovarian cancer risk, however this reduction in is small in her lifetime risk of ovarian cancer is approximately 1 in 100.   Patient feels sure of her decision to have a sterilization procedure.  She desires to have a laparoscopic bilateral salpingectomy.  Note sent to surgical scheduler to who will reach out to the patient to schedule surgery.  Discussed patient's family history of breast and ovarian cancer.  She declines genetic testing at this time.  More than 20 minutes were spent face to face with the patient in the room, reviewing the medical record, labs and images, and coordinating care for the patient. The plan of management was discussed in detail and counseling was provided.    Adelene Idler MD, [facility], [facility] 06/08/2021 3:09 PM

## 2021-06-08 NOTE — Patient Instructions (Signed)
Salpingectomy, Care After The following information offers guidance on how to care for yourself after your procedure. Your health care provider may also give you more specific instructions. If you have problems or questions, contact your health care provider. What can I expect after the procedure? After the procedure, it is common to have: Pain in your abdomen. Light vaginal bleeding (spotting) for a few days. Tiredness. Your recovery time will depend on which method was used for your surgery. Follow these instructions at home: Medicines Take over-the-counter and prescription medicines only as told by your health care provider. Ask your health care provider if the medicine prescribed to you: Requires you to avoid driving or using machinery. Can cause constipation. You may need to take actions to prevent or treat constipation, such as: Drink enough fluid to keep your urine pale yellow. Take over-the-counter or prescription medicines. Eat foods that are high in fiber, such as beans, whole grains, and fresh fruits and vegetables. Limit foods that are high in fat and processed sugars, such as fried or sweet foods. Incision care  Follow instructions from your health care provider about how to take care of your incision or incisions. Make sure you: Wash your hands with soap and water for at least 20 seconds before and after you change your bandage (dressing). If soap and water are not available, use hand sanitizer. Change or remove your dressing as told by your health care provider. Leave stitches (sutures), skin glue, staples, or adhesive strips in place. These skin closures may need to stay in place for 2 weeks or longer. If adhesive strip edges start to loosen and curl up, you may trim the loose edges. Do not remove adhesive strips completely unless your health care provider tells you to do that. Keep your dressing clean and dry. Check your incision area every day for signs of infection. Check  for: Redness, swelling, or pain that gets worse. Fluid or blood. Warmth. Pus or a bad smell. Activity Rest as told by your health care provider. Avoid sitting for a long time without moving. Get up to take short walks every 1-2 hours. This is important to improve blood flow and breathing. Ask for help if you feel weak or unsteady. Return to your normal activities as told by your health care provider. Ask your health care provider what activities are safe for you. Do not drive until your health care provider says that it is safe. Do not lift anything that is heavier than 10 lb (4.5 kg), or the limit that you are told, until your health care provider says that it is safe. This may last for 2-6 weeks depending on your surgery. Do not douche, use tampons, or have sex until your health care provider approves. General instructions Do not use any products that contain nicotine or tobacco. These products include cigarettes, chewing tobacco, and vaping devices, such as e-cigarettes. These can delay healing after surgery. If you need help quitting, ask your health care provider. Wear compression stockings as told by your health care provider. These stockings help to prevent blood clots and reduce swelling in your legs. Do not take baths, swim, or use a hot tub until your health care provider approves. You may take showers. Keep all follow-up visits. This is important. Contact a health care provider if: You have pain when you urinate. You have redness, swelling, or more pain around an incision or an incision feels warm to the touch. You have pus, fluid, blood, or a bad smell   coming from an incision or an incision starts to open. You have a fever. You have abdominal pain that gets worse or does not get better with medicine. You have a rash. You feel light-headed, have nausea and vomiting, or both. Get help right away if: You have pain in your chest or leg. You develop shortness of breath. You  faint. You have increased or heavy vaginal bleeding, such as soaking a sanitary napkin in an hour. These symptoms may represent a serious problem that is an emergency. Do not wait to see if the symptoms will go away. Get medical help right away. Call your local emergency services (911 in the [place] Do not drive yourself to the hospital. Summary After the procedure, it is common to feel tired, have pain in your abdomen, and have light vaginal bleeding for a few days. Follow instructions from your health care provider about how to take care of your incision or incisions. Return to your normal activities as told by your health care provider. Ask your health care provider what activities are safe for you. Do not douche, use tampons, or have sex until your health care provider approves. Keep all follow-up visits. This is important. This information is not intended to replace advice given to you by your health care provider. Make sure you discuss any questions you have with your health care provider. Document Revised: 05/02/2020 Document Reviewed: 05/02/2020 Elsevier Patient Education  2022 [employer] Salpingectomy Salpingectomy, also called tubectomy, is the surgical removal of one of the fallopian tubes. The fallopian tubes allow eggs to travel from the ovaries to the uterus. Removing one fallopian tube does not prevent pregnancy. It also does not cause problems with your menstrual periods. You may need this procedure if you: Have an ectopic pregnancy. This is when a fertilized egg attaches to the fallopian tube instead of the uterus. An ectopic pregnancy can cause the tube to burst or tear (rupture). Have an infected fallopian tube. Have cancer of the fallopian tube or nearby organs. Have had an ovary removed due to a cyst or tumor. Have had your uterus removed. Are at high risk for ovarian cancer. There are three different methods that can be used for a salpingectomy: An open method in which  one large incision is made in your abdomen. A laparoscopic method in which a thin, lighted tube with a tiny camera (laparoscope) is used to help perform the procedure. The laparoscope allows a surgeon to make several small incisions in the abdomen instead of one large incision. A robot-assisted method in which a computer is used to control surgical instruments that are attached to robotic arms. Tell a health care provider about: Any allergies you have. All medicines you are taking, including vitamins, herbs, eye drops, creams, and over-the-counter medicines. Any problems you or family members have had with anesthetic medicines. Any blood disorders you have. Any surgeries you have had. Any medical conditions you have. Whether you are pregnant or may be pregnant. What are the risks? Generally, this is a safe procedure. However, problems may occur, including: Infection. Bleeding. Allergic reactions to medicines. Blood clots in the legs or lungs. Damage to nearby structures or organs. What happens before the procedure? Staying hydrated Follow instructions from your health care provider about hydration, which may include: Up to 2 hours before the procedure - you may continue to drink clear liquids, such as water, clear fruit juice, black coffee, and plain tea. Eating and drinking restrictions Follow instructions from your health care  provider about eating and drinking, which may include: 8 hours before the procedure - stop eating heavy meals or foods, such as meat, fried foods, or fatty foods. 6 hours before the procedure - stop eating light meals or foods, such as toast or cereal. 6 hours before the procedure - stop drinking milk or drinks that contain milk. 2 hours before the procedure - stop drinking clear liquids. Medicines Ask your health care provider about: Changing or stopping your regular medicines. This is especially important if you are taking diabetes medicines or blood  thinners. Taking medicines such as aspirin and ibuprofen. These medicines can thin your blood. Do not take these medicines unless your health care provider tells you to take them. Taking over-the-counter medicines, vitamins, herbs, and supplements. General instructions Do not use any products that contain nicotine or tobacco for at least 4 weeks before the procedure. These products include cigarettes, chewing tobacco, and vaping devices, such as e-cigarettes. If you need help quitting, ask your health care provider. You may have an exam or tests, such as an electrocardiogram (ECG) or a blood or urine test. Ask your health care provider: How your surgery site will be marked. What steps will be taken to help prevent infection. These steps may include: Removing hair at the surgery site. Washing skin with a germ-killing soap. Taking antibiotic medicine. Plan to have a responsible adult take you home from the hospital or clinic. If you will be going home right after the procedure, plan to have a responsible adult care for you for the time you are told. This is important. What happens during the procedure? An IV will be inserted into one of your veins. You will be given one or both of the following: A medicine to help you relax (sedative). A medicine to make you fall asleep (general anesthetic). A small, thin tube (catheter) may be inserted through your urethra and into your bladder. This will drain urine during your procedure. Depending on the type of procedure you are having, one incision or several small incisions will be made in your abdomen. Your fallopian tube and ovary will be cut away from the uterus and removed. Your blood vessels will be clamped and tied to prevent excess bleeding. The incision or incisions in your abdomen will be closed with stitches (sutures), staples, or skin glue. A bandage (dressing) may be placed over your incision or incisions. The procedure may vary among health  care providers and hospitals. What happens after the procedure?  Your blood pressure, heart rate, breathing rate, and blood oxygen level will be monitored until you leave the hospital or clinic. You may continue to receive fluids and medicines through an IV. You may continue to have a catheter draining your urine. You may have to wear compression stockings. These stockings help to prevent blood clots and reduce swelling in your legs. You will be given pain medicine as needed. If you were given a sedative during the procedure, it can affect you for several hours. Do not drive or operate machinery until your health care provider says that it is safe. Summary Salpingectomy is a surgical procedure to remove one of the fallopian tubes. The procedure may be done with an open incision, a thin, lighted tube with a tiny camera (laparoscope), or computer-controlled instruments. Depending on the type of procedure you have, one incision or several small incisions will be made in your abdomen. Your blood pressure, heart rate, breathing rate, and blood oxygen level will be monitored until you  leave the hospital or clinic. Plan to have a responsible adult take you home from the hospital or clinic. This information is not intended to replace advice given to you by your health care provider. Make sure you discuss any questions you have with your health care provider. Document Revised: 05/02/2020 Document Reviewed: 05/02/2020 Elsevier Patient Education  2022 [employer].

## 2021-06-15 ENCOUNTER — Telehealth: Payer: Self-pay | Admitting: Obstetrics and Gynecology

## 2021-06-15 NOTE — Telephone Encounter (Signed)
Attempted to reach the patient to schedule surgery. Call cannot be completed as dialed. No answer, no voicemail.

## 2021-06-15 NOTE — Telephone Encounter (Signed)
-----   Message from Natale Milch, MD sent at 06/08/2021  3:32 PM EST ----- Surgery Booking Request Patient Full Name:  Pam Mitchell  MRN: [id number]  DOB: March 27, 1991  Surgeon: Natale Milch, MD  Requested Surgery Date and Time: at patient preference Primary Diagnosis AND Code: desires sterilization Secondary Diagnosis and Code:  Surgical Procedure: Robot assisted bilateral salpingectomy RNFA Requested?: Yes L&D Notification: No Admission Status: same day surgery Length of Surgery: 50 min Special Case Needs: No H&P: No Phone Interview???:  Yes Interpreter: No Medical Clearance:  No Special Scheduling Instructions: No Any known health/anesthesia issues, diabetes, sleep apnea, latex allergy, defibrillator/pacemaker?: No Acuity: P3   (P1 highest, P2 delay may cause harm, P3 low, elective gyn, P4 lowest) Post op follow up visits: 1 week post op

## 2021-06-24 NOTE — L&D Delivery Note (Signed)
Delivery Note   Pam Mitchell is a 31 y.o. G3P2002 at [redacted]w[redacted]d Estimated Date of Delivery: 03/14/22  PRE-OPERATIVE DIAGNOSIS:  1) [redacted]w[redacted]d pregnancy.   POST-OPERATIVE DIAGNOSIS:  1) [redacted]w[redacted]d pregnancy s/p Vaginal, Spontaneous   Delivery Type: Vaginal, Spontaneous    Delivery Anesthesia: Epidural   Labor Complications: None    ESTIMATED BLOOD LOSS: 500  ml    FINDINGS:   1) female infant, Apgar scores of 9   at 1 minute and 9   at 5 minutes and a birthweight of   ounces.     SPECIMENS:   PLACENTA:   Appearance: Intact    Removal: Spontaneous      Disposition:  Per protocol  CORD BLOOD: Not Indicated  DISPOSITION:  Infant left in stable condition in the delivery room, with L&D personnel and mother,  NARRATIVE SUMMARY: Labor course:  [name] is a G3P2002 at [redacted]w[redacted]d who presented to [facility] for induction of labor. Her initial cervical exam was 1.5/50/-2. Labor proceeded with augmentation and she was found to be completely dilated at 1750. With excellent maternal pushing effort, she birthed a viable female infant at 92. There was no nuchal cord and shoulders were birthed without difficulty. The cord was noted to be short. The infant was placed skin-to-skin with Jazmyne. The cord was doubly clamped and cut by the father when pulsations ceased. The placenta delivered spontaneously Damita Dunnings) and was noted to be intact with a 3VC. A perineal and vaginal examination was performed. Episiotomy/Lacerations: None , small hemostatic bilateral labial lacerations were not repaired  Nasreen tolerated this well. Mother and baby were left in stable condition.   Lloyd Huger, CNM 03/21/2022 6:18 PM

## 2021-07-16 ENCOUNTER — Telehealth: Payer: Self-pay | Admitting: Obstetrics and Gynecology

## 2021-07-16 NOTE — Telephone Encounter (Signed)
Error

## 2021-07-31 ENCOUNTER — Telehealth: Payer: Self-pay

## 2021-07-31 NOTE — Telephone Encounter (Signed)
-----   Message from Natale Milch, MD sent at 06/08/2021  3:32 PM EST ----- Surgery Booking Request Patient Full Name:  Pam Mitchell  MRN: [id number]  DOB: March 27, 1991  Surgeon: Natale Milch, MD  Requested Surgery Date and Time: at patient preference Primary Diagnosis AND Code: desires sterilization Secondary Diagnosis and Code:  Surgical Procedure: Robot assisted bilateral salpingectomy RNFA Requested?: Yes L&D Notification: No Admission Status: same day surgery Length of Surgery: 50 min Special Case Needs: No H&P: No Phone Interview???:  Yes Interpreter: No Medical Clearance:  No Special Scheduling Instructions: No Any known health/anesthesia issues, diabetes, sleep apnea, latex allergy, defibrillator/pacemaker?: No Acuity: P3   (P1 highest, P2 delay may cause harm, P3 low, elective gyn, P4 lowest) Post op follow up visits: 1 week post op

## 2021-07-31 NOTE — Telephone Encounter (Signed)
Rec'd a call from [place] at [facility] in regards to a prior authorization request for CPT [phone]. The procedure is approved. I asked if I would also receive a fax and she said that they only provide call backs.  Auth # [id number] Valid 07/23/21 - 09/20/21

## 2021-07-31 NOTE — Telephone Encounter (Signed)
Called patient to schedule Robot assisted bilateral salpingectomy w Dominica Severin 08/28/21  H&P  n/a  Sent pt new MyChart invite.  Pre-admit phone call appointment to be requested - date and time updated on pt MyChart. Explained that this appointment has a call window. Based on the time scheduled will indicate if the call will be received within a 4 hour window before 1:00 or after.  Advised that pt may also receive calls from the hospital pharmacy and pre-service center.  Confirmed pt has [employer] plan as primary insurance. No secondary insurance.

## 2021-08-22 ENCOUNTER — Other Ambulatory Visit
Admission: RE | Admit: 2021-08-22 | Discharge: 2021-08-22 | Disposition: A | Discharge status: Home / Self Care | Payer: PRIVATE HEALTH INSURANCE | Source: Ambulatory Visit | Attending: Obstetrics and Gynecology | Admitting: Obstetrics and Gynecology

## 2021-08-22 ENCOUNTER — Other Ambulatory Visit: Payer: Self-pay

## 2021-08-22 ENCOUNTER — Encounter: Payer: Self-pay | Admitting: Urgent Care

## 2021-08-22 ENCOUNTER — Encounter
Admission: RE | Admit: 2021-08-22 | Discharge: 2021-08-22 | Disposition: A | Discharge status: Home / Self Care | Payer: PRIVATE HEALTH INSURANCE | Source: Ambulatory Visit | Attending: Obstetrics and Gynecology | Admitting: Obstetrics and Gynecology

## 2021-08-22 DIAGNOSIS — Z01812 Encounter for preprocedural laboratory examination: Secondary | ICD-10-CM | POA: Diagnosis not present

## 2021-08-22 DIAGNOSIS — Z3009 Encounter for other general counseling and advice on contraception: Secondary | ICD-10-CM

## 2021-08-22 DIAGNOSIS — Z7189 Other specified counseling: Secondary | ICD-10-CM | POA: Diagnosis not present

## 2021-08-22 LAB — TYPE AND SCREEN
ABO/RH(D): A POS
Antibody Screen: NEGATIVE

## 2021-08-22 LAB — CBC
HCT: 36.1 % (ref 36.0–46.0)
Hemoglobin: 12.3 g/dL (ref 12.0–15.0)
MCH: 30.6 pg (ref 26.0–34.0)
MCHC: 34.1 g/dL (ref 30.0–36.0)
MCV: 89.8 fL (ref 80.0–100.0)
Platelets: 270 10*3/uL (ref 150–400)
RBC: 4.02 MIL/uL (ref 3.87–5.11)
RDW: 12.1 % (ref 11.5–15.5)
WBC: 8.3 10*3/uL (ref 4.0–10.5)
nRBC: 0 % (ref 0.0–0.2)

## 2021-08-22 NOTE — Patient Instructions (Addendum)
Your procedure is scheduled on: Tuesday, March 7 ?Report to the Registration Desk on the 1st floor of the [facility]. ?To find out your arrival time, please call [phone] between 1PM - 3PM on: Monday, March 6 ? ?REMEMBER: ?Instructions that are not followed completely may result in serious medical risk, up to and including death; or upon the discretion of your surgeon and anesthesiologist your surgery may need to be rescheduled. ? ?Do not eat food after midnight the night before surgery.  ?No gum chewing, lozengers or hard candies. ? ?You may however, drink CLEAR liquids up to 2 hours before you are scheduled to arrive for your surgery. Do not drink anything within 2 hours of your scheduled arrival time. ? ?Clear liquids include: ?- water  ?- apple juice without pulp ?- gatorade (not RED colors) ?- black coffee or tea (Do NOT add milk or creamers to the coffee or tea) ?Do NOT drink anything that is not on this list. ? ?In addition, your doctor has ordered for you to drink the provided  ?Ensure Pre-Surgery Clear Carbohydrate Drink  ?Drinking this carbohydrate drink up to two hours before surgery helps to reduce insulin resistance and improve patient outcomes. Please complete drinking 2 hours prior to scheduled arrival time. ? ?DO NOT TAKE ANY MEDICATIONS THE MORNING OF SURGERY ? ?One week prior to surgery:  ?Stop Anti-inflammatories (NSAIDS) such as Advil, Aleve, Ibuprofen, Motrin, Naproxen, Naprosyn and Aspirin based products such as Excedrin, Goodys Powder, BC Powder. ?Stop ANY OVER THE COUNTER supplements until after surgery. ?You may however, continue to take Tylenol if needed for pain up until the day of surgery. ? ?No Alcohol for 24 hours before or after surgery. ? ?No Smoking including e-cigarettes for 24 hours prior to surgery.  ?No chewable tobacco products for at least 6 hours prior to surgery.  ?No nicotine patches on the day of surgery. ? ?Do not use any "recreational" drugs for at least a week  prior to your surgery.  ?Please be advised that the combination of cocaine and anesthesia may have negative outcomes, up to and including death. ?If you test positive for cocaine, your surgery will be cancelled. ? ?On the morning of surgery brush your teeth with toothpaste and water, you may rinse your mouth with mouthwash if you wish. ?Do not swallow any toothpaste or mouthwash. ? ?Use CHG Soap as directed on instruction sheet. ? ?Do not wear jewelry, make-up, hairpins, clips or nail polish. ? ?Do not wear lotions, powders, or perfumes.  ? ?Do not shave body from the neck down 48 hours prior to surgery just in case you cut yourself which could leave a site for infection.  ?Also, freshly shaved skin may become irritated if using the CHG soap. ? ?Contact lenses, hearing aids and dentures may not be worn into surgery. ? ?Do not bring valuables to the hospital. [facility] is not responsible for any missing/lost belongings or valuables.  ? ?Notify your doctor if there is any change in your medical condition (cold, fever, infection). ? ?Wear comfortable clothing (specific to your surgery type) to the hospital. ? ?After surgery, you can help prevent lung complications by doing breathing exercises.  ?Take deep breaths and cough every 1-2 hours. Your doctor may order a device called an Incentive Spirometer to help you take deep breaths. ?When coughing or sneezing, hold a pillow firmly against your incision with both hands. This is called ?splinting.? Doing this helps protect your incision. It also decreases belly discomfort. ? ?  If you are being discharged the day of surgery, you will not be allowed to drive home. ?You will need a responsible adult (18 years or older) to drive you home and stay with you that night.  ? ?If you are taking public transportation, you will need to have a responsible adult (18 years or older) with you. ?Please confirm with your physician that it is acceptable to use public transportation.   ? ?Please call the Pre-admissions Testing Dept. at [phone] if you have any questions about these instructions. ? ?Surgery Visitation Policy: ? ?Patients undergoing a surgery or procedure may have one family member or support person with them as long as that person is not COVID-19 positive or experiencing its symptoms.  ?That person may remain in the waiting area during the procedure and may rotate out with other people. ?

## 2021-08-28 ENCOUNTER — Ambulatory Visit: Payer: PRIVATE HEALTH INSURANCE | Admitting: Certified Registered Nurse Anesthetist

## 2021-08-28 ENCOUNTER — Ambulatory Visit
Admission: RE | Admit: 2021-08-28 | Discharge: 2021-08-28 | Disposition: A | Discharge status: Home / Self Care | Payer: PRIVATE HEALTH INSURANCE | Attending: Obstetrics and Gynecology | Admitting: Obstetrics and Gynecology

## 2021-08-28 ENCOUNTER — Other Ambulatory Visit: Payer: Self-pay | Admitting: Obstetrics and Gynecology

## 2021-08-28 ENCOUNTER — Encounter
Admission: RE | Disposition: A | Discharge status: Home / Self Care | Payer: Self-pay | Source: Home / Self Care | Attending: Obstetrics and Gynecology

## 2021-08-28 DIAGNOSIS — Z5309 Procedure and treatment not carried out because of other contraindication: Secondary | ICD-10-CM | POA: Diagnosis present

## 2021-08-28 DIAGNOSIS — Z331 Pregnant state, incidental: Secondary | ICD-10-CM | POA: Insufficient documentation

## 2021-08-28 DIAGNOSIS — Z3490 Encounter for supervision of normal pregnancy, unspecified, unspecified trimester: Secondary | ICD-10-CM

## 2021-08-28 DIAGNOSIS — Z3009 Encounter for other general counseling and advice on contraception: Secondary | ICD-10-CM

## 2021-08-28 LAB — POCT PREGNANCY, URINE: Preg Test, Ur: POSITIVE — AB

## 2021-08-28 SURGERY — SALPINGECTOMY, ROBOT-ASSISTED
Anesthesia: Choice | Laterality: Bilateral

## 2021-08-28 MED ORDER — ONDANSETRON HCL 4 MG/2ML IJ SOLN
INTRAMUSCULAR | Status: AC
  Filled 2021-08-28: qty 2

## 2021-08-28 MED ORDER — MIDAZOLAM HCL 2 MG/2ML IJ SOLN
INTRAMUSCULAR | Status: AC
  Filled 2021-08-28: qty 2

## 2021-08-28 MED ORDER — FAMOTIDINE 20 MG PO TABS: 20.0000 mg | ORAL_TABLET | Freq: Once | ORAL | Status: DC

## 2021-08-28 MED ORDER — DEXAMETHASONE SODIUM PHOSPHATE 10 MG/ML IJ SOLN
INTRAMUSCULAR | Status: AC
  Filled 2021-08-28: qty 1

## 2021-08-28 MED ORDER — FENTANYL CITRATE (PF) 100 MCG/2ML IJ SOLN
INTRAMUSCULAR | Status: AC
  Filled 2021-08-28: qty 2

## 2021-08-28 MED ORDER — ROCURONIUM BROMIDE 10 MG/ML (PF) SYRINGE
PREFILLED_SYRINGE | INTRAVENOUS | Status: AC
  Filled 2021-08-28: qty 10

## 2021-08-28 MED ORDER — LACTATED RINGERS IV SOLN: INTRAVENOUS | Status: DC

## 2021-08-28 MED ORDER — CEFAZOLIN SODIUM-DEXTROSE 2-4 GM/100ML-% IV SOLN: 2.0000 g | INTRAVENOUS | Status: DC

## 2021-08-28 SURGICAL SUPPLY — 64 items
APPLICATOR ARISTA FLEXITIP XL (MISCELLANEOUS) IMPLANT
BACTOSHIELD CHG 4% 4OZ (MISCELLANEOUS) ×1
BAG LAPAROSCOPIC 12 15 PORT 16 (BASKET) IMPLANT
BAG RETRIEVAL 12/15 (BASKET)
BAG URINE DRAIN 2000ML AR STRL (UROLOGICAL SUPPLIES) ×2 IMPLANT
BASIN GRAD PLASTIC 32OZ STRL (MISCELLANEOUS) ×2 IMPLANT
BLADE SURG SZ10 CARB STEEL (BLADE) IMPLANT
BLADE SURG SZ11 CARB STEEL (BLADE) ×2 IMPLANT
CATH FOLEY SIL 2WAY 14FR5CC (CATHETERS) ×2 IMPLANT
CHLORAPREP W/TINT 26 (MISCELLANEOUS) ×2 IMPLANT
COVER MAYO STAND REUSABLE (DRAPES) ×2 IMPLANT
COVER WAND RF STERILE (DRAPES) ×2 IMPLANT
DERMABOND ADVANCED (GAUZE/BANDAGES/DRESSINGS) ×1
DERMABOND ADVANCED .7 DNX12 (GAUZE/BANDAGES/DRESSINGS) ×1 IMPLANT
DRAPE ARM DVNC X/XI (DISPOSABLE) ×3 IMPLANT
DRAPE COLUMN DVNC XI (DISPOSABLE) ×1 IMPLANT
DRAPE DA VINCI XI ARM (DISPOSABLE) ×3
DRAPE DA VINCI XI COLUMN (DISPOSABLE) ×1
DRAPE ROBOT W/ LEGGING 30X125 (DRAPES) ×2 IMPLANT
DRSG TEGADERM 2-3/8X2-3/4 SM (GAUZE/BANDAGES/DRESSINGS) ×6 IMPLANT
ELECT REM PT RETURN 9FT ADLT (ELECTROSURGICAL) ×2
ELECTRODE REM PT RTRN 9FT ADLT (ELECTROSURGICAL) ×1 IMPLANT
GAUZE 4X4 16PLY ~~LOC~~+RFID DBL (SPONGE) ×2 IMPLANT
GLOVE SURG SYN 6.5 ES PF (GLOVE) ×6 IMPLANT
GLOVE SURG UNDER POLY LF SZ6.5 (GLOVE) ×6 IMPLANT
GOWN STRL REUS W/ TWL LRG LVL3 (GOWN DISPOSABLE) ×3 IMPLANT
GOWN STRL REUS W/TWL LRG LVL3 (GOWN DISPOSABLE) ×3
GRASPER SUT TROCAR 14GX15 (MISCELLANEOUS) ×2 IMPLANT
HEMOSTAT ARISTA ABSORB 3G PWDR (HEMOSTASIS) IMPLANT
IRRIGATION STRYKERFLOW (MISCELLANEOUS) IMPLANT
IRRIGATOR STRYKERFLOW (MISCELLANEOUS)
IRRIGATOR SUCT 8 DISP DVNC XI (IRRIGATION / IRRIGATOR) IMPLANT
IRRIGATOR SUCTION 8MM XI DISP (IRRIGATION / IRRIGATOR)
IV NS 1000ML (IV SOLUTION)
IV NS 1000ML BAXH (IV SOLUTION) IMPLANT
KIT PINK PAD W/HEAD ARE REST (MISCELLANEOUS) ×2
KIT PINK PAD W/HEAD ARM REST (MISCELLANEOUS) ×1 IMPLANT
LABEL OR SOLS (LABEL) ×2 IMPLANT
MANIFOLD NEPTUNE II (INSTRUMENTS) ×2 IMPLANT
MANIPULATOR UTERINE 4.5 ZUMI (MISCELLANEOUS) ×2 IMPLANT
NEEDLE HYPO 22GX1.5 SAFETY (NEEDLE) ×2 IMPLANT
NS IRRIG 1000ML POUR BTL (IV SOLUTION) ×4 IMPLANT
OBTURATOR OPTICAL STANDARD 8MM (TROCAR) ×1
OBTURATOR OPTICAL STND 8 DVNC (TROCAR) ×1
OBTURATOR OPTICALSTD 8 DVNC (TROCAR) ×1 IMPLANT
PACK GYN LAPAROSCOPIC (MISCELLANEOUS) ×2 IMPLANT
PAD ARMBOARD 7.5X6 YLW CONV (MISCELLANEOUS) ×2 IMPLANT
PAD OB MATERNITY 4.3X12.25 (PERSONAL CARE ITEMS) ×2 IMPLANT
PAD PREP 24X41 OB/GYN DISP (PERSONAL CARE ITEMS) ×2 IMPLANT
SCRUB CHG 4% DYNA-HEX 4OZ (MISCELLANEOUS) ×1 IMPLANT
SEAL CANN UNIV 5-8 DVNC XI (MISCELLANEOUS) ×3 IMPLANT
SEAL XI 5MM-8MM UNIVERSAL (MISCELLANEOUS) ×3
SEALER VESSEL DA VINCI XI (MISCELLANEOUS) ×1
SEALER VESSEL EXT DVNC XI (MISCELLANEOUS) ×1 IMPLANT
SET TUBE SMOKE EVAC HIGH FLOW (TUBING) ×2 IMPLANT
SPONGE GAUZE 2X2 8PLY STRL LF (GAUZE/BANDAGES/DRESSINGS) ×6 IMPLANT
SURGILUBE 2OZ TUBE FLIPTOP (MISCELLANEOUS) ×2 IMPLANT
SUT MNCRL 4-0 (SUTURE) ×1
SUT MNCRL 4-0 27XMFL (SUTURE) ×1
SUT VIC AB 0 CT1 36 (SUTURE) IMPLANT
SUTURE MNCRL 4-0 27XMF (SUTURE) ×1 IMPLANT
SYR 10ML LL (SYRINGE) ×2 IMPLANT
TAPE TRANSPORE STRL 2 31045 (GAUZE/BANDAGES/DRESSINGS) ×2 IMPLANT
WATER STERILE IRR 500ML POUR (IV SOLUTION) ×2 IMPLANT

## 2021-08-28 NOTE — Progress Notes (Signed)
[place] for pregnancy ordered. Salpingectomy canceled. Follow up outpatient.  ?

## 2021-08-28 NOTE — Pre-Procedure Instructions (Signed)
Positive pregnancy.  Surgery cancelled ?

## 2021-09-05 ENCOUNTER — Other Ambulatory Visit: Payer: Self-pay | Admitting: Obstetrics and Gynecology

## 2021-09-05 ENCOUNTER — Other Ambulatory Visit: Payer: Self-pay

## 2021-09-05 ENCOUNTER — Ambulatory Visit (INDEPENDENT_AMBULATORY_CARE_PROVIDER_SITE_OTHER): Payer: No Typology Code available for payment source | Admitting: Obstetrics and Gynecology

## 2021-09-05 ENCOUNTER — Encounter: Payer: Self-pay | Admitting: Obstetrics and Gynecology

## 2021-09-05 VITALS — BP 100/60 | Ht 66.0 in | Wt 155.0 lb

## 2021-09-05 DIAGNOSIS — Z3490 Encounter for supervision of normal pregnancy, unspecified, unspecified trimester: Secondary | ICD-10-CM

## 2021-09-05 NOTE — Progress Notes (Signed)
? ?Patient ID: Pam Mitchell, female   DOB: 1991-02-20, 31 y.o.   MRN: [id number] ? ?Reason for Consult: Consult ?  ?Referred by No ref. provider found ? ?Subjective:  ?   ?HPI: ? ?Pam Mitchell is a 31 y.o. female She presents today for follow-up.  Her surgery was canceled last week because she is found to be pregnant at the time of her requesting sterilization.  She reports that she has not had pregnancy symptoms.  She has not been having pain or cramping.  She has not experienced nausea or vomiting.  She is uncertain when the first day of her last menstrual period was.  She is uncertain when she may have conceived.  She is uncertain if she plans to continue the pregnancy and would like to discuss all of her options. ? ?Gynecological History ? ?Patient's last menstrual period was 07/31/2021. ? ?Past Medical History:  ?Diagnosis Date  ? Encounter for induction of labor 09/01/2019  ? Supervision of other normal pregnancy, antepartum 02/01/2019  ? Clinic [facility] Prenatal Labs Dating L=11 Blood type: A/Positive/-- (08/03 1518)  Genetic Screen Declined 8/18 Antibody:Negative (08/03 1518) Anatomic [place] Complete Rubella: 3.09 (08/03 1518) Varicella: Non-immune GTT  Third trimester:WNL  RPR: Non Reactive (08/03 1518)  Rhogam N/A HBsAg: Negative (08/03 1518)  TDaP vaccine  07/02/19   Flu Shot: 10/14 HIV: Non Reactive (08/03 1518)  Baby Food Breast    ? ?Family History  ?Problem Relation Age of Onset  ? Ovarian cancer Paternal Grandmother   ? ?Past Surgical History:  ?Procedure Laterality Date  ? TONSILLECTOMY AND ADENOIDECTOMY  2006  ? ? ?Short Social History:  ?Social History  ? ?Tobacco Use  ? Smoking status: Former  ?  Types: Cigarettes  ? Smokeless tobacco: Never  ?Substance Use Topics  ? Alcohol use: Yes  ?  Comment: occassional  ? ? ?No Known Allergies ? ?Current Outpatient Medications  ?Medication Sig Dispense Refill  ? ibuprofen (ADVIL) 200 MG tablet Take 400 mg by mouth every 6 (six) hours as needed for moderate  pain or headache. (Patient not taking: Reported on 09/05/2021)    ? ?No current facility-administered medications for this visit.  ? ? ?Review of Systems  ?Constitutional: Negative for chills, fatigue, fever and unexpected weight change.  ?HENT: Negative for trouble swallowing.  ?Eyes: Negative for loss of vision.  ?Respiratory: Negative for cough, shortness of breath and wheezing.  ?Cardiovascular: Negative for chest pain, leg swelling, palpitations and syncope.  ?GI: Negative for abdominal pain, blood in stool, diarrhea, nausea and vomiting.  ?GU: Negative for difficulty urinating, dysuria, frequency and hematuria.  ?Musculoskeletal: Negative for back pain, leg pain and joint pain.  ?Skin: Negative for rash.  ?Neurological: Negative for dizziness, headaches, light-headedness, numbness and seizures.  ?Psychiatric: Negative for behavioral problem, confusion, depressed mood and sleep disturbance.   ? ?   ?Objective:  ?Objective  ? ?Vitals:  ? 09/05/21 1528  ?BP: 100/60  ?Weight: 155 lb (70.3 kg)  ?Height: 5\' 6"  (1.676 m)  ? ?Body mass index is 25.02 kg/m?. ? ?Physical Exam ?Vitals and nursing note reviewed. Exam conducted with a chaperone present.  ?Constitutional:   ?   Appearance: Normal appearance.  ?HENT:  ?   Head: Normocephalic and atraumatic.  ?Eyes:  ?   Extraocular Movements: Extraocular movements intact.  ?   Pupils: Pupils are equal, round, and reactive to light.  ?Cardiovascular:  ?   Rate and Rhythm: Normal rate and regular rhythm.  ?Pulmonary:  ?  Effort: Pulmonary effort is normal.  ?   Breath sounds: Normal breath sounds.  ?Abdominal:  ?   General: Abdomen is flat.  ?   Palpations: Abdomen is soft.  ?Musculoskeletal:  ?   Cervical back: Normal range of motion.  ?Skin: ?   General: Skin is warm and dry.  ?Neurological:  ?   General: No focal deficit present.  ?   Mental Status: She is alert and oriented to person, place, and time.  ?Psychiatric:     ?   Behavior: Behavior normal.     ?   Thought  Content: Thought content normal.     ?   Judgment: Judgment normal.  ? ? ?Assessment/Plan:  ?  ? ?31 year old G3 P2-0-0-2. ?Abdominal ultrasound performed in office today.  Patient's crown-rump length was consistent with 11 weeks 5 days.  Fetal heart rate seen at 163 bpm.  Discussed that this is not sufficient for dating ultrasound and that she should follow-up as planned for her dating ultrasound on 09/20/2021. ? ?Reviewed patient's options for pregnancy termination versus continuation of pregnancy.  Encouraged to [facility] with [facility] regarding termination.  Reviewed [facility] laws for gestational age limits for terminations. ?  ?Adelene Idler MD ?[facility], [facility] ?09/05/2021 ?3:47 PM ? ? ?

## 2021-09-20 ENCOUNTER — Ambulatory Visit: Payer: No Typology Code available for payment source

## 2021-09-20 DIAGNOSIS — Z3490 Encounter for supervision of normal pregnancy, unspecified, unspecified trimester: Secondary | ICD-10-CM

## 2021-10-05 ENCOUNTER — Other Ambulatory Visit: Payer: Self-pay | Admitting: Obstetrics and Gynecology

## 2021-10-05 DIAGNOSIS — Z3490 Encounter for supervision of normal pregnancy, unspecified, unspecified trimester: Secondary | ICD-10-CM

## 2021-10-05 NOTE — Progress Notes (Signed)
Placed order for anatomy scan ?

## 2021-10-08 ENCOUNTER — Ambulatory Visit (INDEPENDENT_AMBULATORY_CARE_PROVIDER_SITE_OTHER): Payer: No Typology Code available for payment source

## 2021-10-08 DIAGNOSIS — Z3689 Encounter for other specified antenatal screening: Secondary | ICD-10-CM

## 2021-10-08 DIAGNOSIS — Z3480 Encounter for supervision of other normal pregnancy, unspecified trimester: Secondary | ICD-10-CM

## 2021-10-08 NOTE — Initial Assessments (Signed)
New OB Intake ? ?I connected with  Pam Mitchell on 10/08/21 at  2:15 PM EDT by telephone Telephone Visit and verified that I am speaking with the correct person using two identifiers. Nurse is located at [facility] and pt is located at work. ? ?I explained I am completing New OB Intake today. We discussed her EDD of 03/14/2022 that is based on LMP of not sure as she was pregnant when she had her last period per CRS's note. Pt is G3/P2. I reviewed her allergies, medications, Medical/Surgical/OB history, and appropriate screenings. I informed her of [facility] services. Based on history, this is a/an  pregnancy uncomplicated .  ? ?Patient Active Problem List  ? Diagnosis Date Noted  ? Normal vaginal delivery 09/01/2019  ? Postpartum care following vaginal delivery 09/01/2019  ? Anemia during pregnancy in third trimester 06/16/2019  ? ? ?Concerns addressed today ? ?Delivery Plans:  ?Plans to deliver at [facility] L[facility].  ? ? [place] ?Explained first scheduled [place] will be scheduled at her first visit with provider. ? ?Labs ?Pt aware NOB labs will be drawn at first visit with provider. ? ?Patient has not covid vaccine.   ? ?Social Determinants of Health ?Food Insecurity: Patient denies food insecurity.  ?Transportation: Patient denies transportation needs. ? ?Placed OB Box on problem list and updated ? ?First visit review ?I reviewed new OB appt with pt. I explained she will have a pelvic exam, ob bloodwork with genetic screening, and PAP smear. Explained pt will be seen by Gigi Gin, CNM at first visit; encounter routed to appropriate provider.   ?Cleophas Dunker, [facility]) ?10/08/2021  2:11 PM  ?

## 2021-10-09 NOTE — Progress Notes (Signed)
See NOB Intake documentation in 'NOTES'.

## 2021-10-12 ENCOUNTER — Encounter: Payer: Self-pay | Admitting: Obstetrics

## 2021-10-12 ENCOUNTER — Ambulatory Visit (INDEPENDENT_AMBULATORY_CARE_PROVIDER_SITE_OTHER): Payer: No Typology Code available for payment source | Admitting: Obstetrics

## 2021-10-12 ENCOUNTER — Other Ambulatory Visit (HOSPITAL_COMMUNITY)
Admission: RE | Admit: 2021-10-12 | Discharge: 2021-10-12 | Disposition: A | Discharge status: Home / Self Care | Payer: No Typology Code available for payment source | Source: Ambulatory Visit | Attending: Obstetrics | Admitting: Obstetrics

## 2021-10-12 VITALS — BP 120/60 | Wt 160.0 lb

## 2021-10-12 DIAGNOSIS — Z3480 Encounter for supervision of other normal pregnancy, unspecified trimester: Secondary | ICD-10-CM | POA: Diagnosis present

## 2021-10-12 DIAGNOSIS — Z113 Encounter for screening for infections with a predominantly sexual mode of transmission: Secondary | ICD-10-CM | POA: Diagnosis present

## 2021-10-12 DIAGNOSIS — Z3A18 18 weeks gestation of pregnancy: Secondary | ICD-10-CM

## 2021-10-12 LAB — POCT URINALYSIS DIPSTICK OB
Glucose, UA: NEGATIVE
POC,PROTEIN,UA: NEGATIVE

## 2021-10-12 NOTE — Progress Notes (Signed)
? ? ? ? ? ?New Obstetric Patient H&P  ? ? ?Chief Complaint: "Desires prenatal care" ? ? ?History of Present Illness: Patient is a 31 y.o. [id number] Not Hispanic or Latino female, LMP unknown presents with amenorrhea and positive home pregnancy test. Based on her  LMP, her EDD is Estimated Date of Delivery: 03/14/22 and her EGA is [redacted]w[redacted]d. Cycles are 6. days, regular, and occur approximately every : 28 days. Her last pap smear was about 1  years ago and was no abnormalities.  ?  ?She had a urine pregnancy test which was positive about 8  week(s)  ago. Her last menstrual period was normal and lasted for  about 6  day(s). Since her LMP she claims she has experienced fetal movement. She denies vaginal bleeding. Her past medical history is noncontributory. Her prior pregnancies are notable for none ? ?Since her LMP, she admits to the use of tobacco products  no ?She claims she has gained    an unknown amount  pounds since the start of her pregnancy.  ?There are cats in the home in the home  no ?She admits close contact with children on a regular basis  yes  ?She has had chicken pox in the past unknown ?She has had Tuberculosis exposures, symptoms, or previously tested positive for TB   no ?Current or past history of domestic violence. no ? ?Genetic Screening/Teratology Counseling: (Includes patient, baby's father, or anyone in either family with:)  ? ?1. Patient's age >/= 23 at [facility]  no ?2. Thalassemia ([place], [place], [place], or Asian background): MCV<80  no ?3. Neural tube defect (meningomyelocele, spina bifida, anencephaly)  no ?4. Congenital heart defect  no  ?5. Down syndrome  no ?6. Tay-Sachs (Jewish, [place])  no ?7. Canavan's Disease  no ?8. Sickle cell disease or trait (African)  no  ?9. Hemophilia or other blood disorders  no  ?10. Muscular dystrophy  no  ?11. Cystic fibrosis  no  ?12. Huntington's Chorea  no  ?13. Mental retardation/autism  no ?14. Other inherited genetic or chromosomal disorder   no ?15. Maternal metabolic disorder (DM, PKU, etc)  no ?16. Patient or FOB with a child with a birth defect not listed above no  ?16a. Patient or FOB with a birth defect themselves no ?17. Recurrent pregnancy loss, or stillbirth  no  ?18. Any medications since LMP other than prenatal vitamins (include vitamins, supplements, OTC meds, drugs, alcohol)  no ?19. Any other genetic/environmental exposure to discuss  no ? ?Infection History:  ? ?1. Lives with someone with TB or TB exposed  no  ?2. Patient or partner has history of genital herpes  no ?3. Rash or viral illness since LMP  no ?4. History of STI (GC, CT, HPV, syphilis, HIV)  no ?5. History of recent travel :  no ? ?Other pertinent information:  no  ? ? ? ?Review of Systems:10 point review of systems negative unless otherwise noted in HPI ? ?Past Medical History:  ?Past Medical History:  ?Diagnosis Date  ? Encounter for induction of labor 09/01/2019  ? Supervision of other normal pregnancy, antepartum 02/01/2019  ? [facility] Prenatal Labs Dating L=11 Blood type: A/Positive/-- (08/03 1518)  Genetic Screen Declined 8/18 Antibody:Negative (08/03 1518) Anatomic [place] Complete Rubella: 3.09 (08/03 1518) Varicella: Non-immune GTT  Third trimester:WNL  RPR: Non Reactive (08/03 1518)  Rhogam N/A HBsAg: Negative (08/03 1518)  TDaP vaccine  07/02/19   Flu Shot: 10/14 HIV: Non Reactive (08/03 1518)  Baby Food Breast    ? ? ?Past Surgical History:  ?Past Surgical History:  ?Procedure Laterality Date  ? TONSILLECTOMY AND ADENOIDECTOMY  2006  ? ? ?Gynecologic History: Patient's last menstrual period was 07/31/2021. ? ?Obstetric History: [id number] ? ?Family History:  ?Family History  ?Problem Relation Age of Onset  ? Ovarian cancer Paternal Grandmother   ? ? ?Social History:  ?Social History  ? ?Socioeconomic History  ? Marital status: Significant Other  ?  Spouse name: Not on file  ? Number of children: 2  ? Years of education: 14.5  ? Highest education level: Not on file   ?Occupational History  ? Occupation: bookkeeper  ?Tobacco Use  ? Smoking status: Former  ?  Types: Cigarettes  ? Smokeless tobacco: Never  ?Vaping Use  ? Vaping Use: Never used  ?Substance and Sexual Activity  ? Alcohol use: Not Currently  ?  Comment: occassional  ? Drug use: Not Currently  ? Sexual activity: Yes  ?  Partners: Male  ?  Birth control/protection: None  ?Other Topics Concern  ? Not on file  ?Social History Narrative  ? Not on file  ? ?Social Determinants of Health  ? ?Financial Resource Strain: Low Risk   ? Difficulty of Paying Living Expenses: Not hard at all  ?Food Insecurity: No Food Insecurity  ? Worried About Charity fundraiser in the Last Year: Never true  ? Ran Out of Food in the Last Year: Never true  ?Transportation Needs: No Transportation Needs  ? Lack of Transportation (Medical): No  ? Lack of Transportation (Non-Medical): No  ?Physical Activity: Sufficiently Active  ? Days of Exercise per Week: 4 days  ? Minutes of Exercise per Session: 40 min  ?Stress: No Stress Concern Present  ? Feeling of Stress : Not at all  ?Social Connections: Moderately Isolated  ? Frequency of Communication with Friends and Family: More than three times a week  ? Frequency of Social Gatherings with Friends and Family: Not on file  ? Attends Religious Services: Never  ? Active Member of Clubs or Organizations: No  ? Attends Archivist Meetings: Never  ? Marital Status: Living with partner  ?Intimate Partner Violence: Not At Risk  ? Fear of Current or Ex-Partner: No  ? Emotionally Abused: No  ? Physically Abused: No  ? Sexually Abused: No  ? ? ?Allergies:  ?No Known Allergies ? ?Medications: ?Prior to Admission medications   ?Medication Sig Start Date End Date Taking? Authorizing Provider  ?prenatal vitamin w/FE, FA (NATACHEW) 29-1 MG CHEW chewable tablet Chew 1 tablet by mouth daily at 12 noon.   Yes [provider]  ?ibuprofen (ADVIL) 200 MG tablet Take 400 mg by mouth every 6 (six) hours as  needed for moderate pain or headache. ?Patient not taking: Reported on 09/05/2021    [provider]  ? ? ?Physical Exam ?Vitals: Blood pressure 120/60, weight 160 lb (72.6 kg), last menstrual period 07/31/2021. ? ?General: NAD ?HEENT: normocephalic, anicteric ?Thyroid: no enlargement, no palpable nodules ?Pulmonary: No increased work of breathing, CTAB ?Cardiovascular: RRR, distal pulses 2+ ?Abdomen: NABS, soft, non-tender, non-distended.  Umbilicus without lesions.  No hepatomegaly, splenomegaly or masses palpable. No evidence of hernia  ?Genitourinary: ? External: Normal external female genitalia.  Normal urethral meatus, normal  Bartholin's and Skene's glands.   ? Vagina: Normal vaginal mucosa, no evidence of prolapse.   ? Cervix: Grossly normal in appearance, no bleeding ? Uterus: anteverted,  Measures approximately [redacted] weeks  gestation-enlarged, mobile, normal contour.  No CMT ? Adnexa: ovaries non-enlarged, no adnexal masses ? Rectal: deferred ?Extremities: no edema, erythema, or tenderness ?Neurologic: Grossly intact ?Psychiatric: mood appropriate, affect full ? ? ?Assessment: 31 y.o. G3P2002 at [redacted]w[redacted]d presenting to initiate prenatal care ? ?Plan: ?1) Avoid alcoholic beverages. ?2) Patient encouraged not to smoke.  ?3) Discontinue the use of all non-medicinal drugs and chemicals.  ?4) Take prenatal vitamins daily.  ?5) Nutrition, food safety (fish, cheese advisories, and high nitrite foods) and exercise discussed. ?6) Hospital and practice style discussed with cross coverage system.  ?7) Genetic Screening, such as with 1st Trimester Screening, cell free fetal DNA, AFP testing, and Ultrasound, as well as with amniocentesis and CVS as appropriate, is discussed with patient. At the conclusion of today's visit patient declined genetic testing ?8) Patient is asked about travel to areas at risk for the Zika virus, and counseled to avoid travel and exposure to mosquitoes or sexual partners who may have  themselves been exposed to the virus. Testing is discussed, and will be ordered as appropriate.  ?The following were addressed during this visit: ? ?Breastfeeding Education ?- Early initiation of breastfeeding  ?  C

## 2021-10-13 LAB — CBC/D/PLT+RPR+RH+ABO+RUBIGG...
Antibody Screen: NEGATIVE
Basophils Absolute: 0 10*3/uL (ref 0.0–0.2)
Basos: 0 %
EOS (ABSOLUTE): 0.1 10*3/uL (ref 0.0–0.4)
Eos: 1 %
HCV Ab: NONREACTIVE
HIV Screen 4th Generation wRfx: NONREACTIVE
Hematocrit: 32.7 % — ABNORMAL LOW (ref 34.0–46.6)
Hemoglobin: 11.1 g/dL (ref 11.1–15.9)
Hepatitis B Surface Ag: NEGATIVE
Immature Grans (Abs): 0 10*3/uL (ref 0.0–0.1)
Immature Granulocytes: 0 %
Lymphocytes Absolute: 2.1 10*3/uL (ref 0.7–3.1)
Lymphs: 28 %
MCH: 30.1 pg (ref 26.6–33.0)
MCHC: 33.9 g/dL (ref 31.5–35.7)
MCV: 89 fL (ref 79–97)
Monocytes Absolute: 0.3 10*3/uL (ref 0.1–0.9)
Monocytes: 5 %
Neutrophils Absolute: 4.9 10*3/uL (ref 1.4–7.0)
Neutrophils: 66 %
Platelets: 278 10*3/uL (ref 150–450)
RBC: 3.69 x10E6/uL — ABNORMAL LOW (ref 3.77–5.28)
RDW: 12.5 % (ref 11.7–15.4)
RPR Ser Ql: NONREACTIVE
Rh Factor: POSITIVE
Rubella Antibodies, IGG: 3.4 index (ref >0.99)
Varicella zoster IgG: <135 index — ABNORMAL LOW (ref >165)
WBC: 7.5 10*3/uL (ref 3.4–10.8)

## 2021-10-13 LAB — HCV INTERPRETATION

## 2021-10-14 LAB — URINE DRUG PANEL 7
Amphetamines, Urine: NEGATIVE ng/mL
Barbiturate Quant, Ur: NEGATIVE ng/mL
Benzodiazepine Quant, Ur: NEGATIVE ng/mL
Cannabinoid Quant, Ur: NEGATIVE ng/mL
Cocaine (Metab.): NEGATIVE ng/mL
Opiate Quant, Ur: NEGATIVE ng/mL
PCP Quant, Ur: NEGATIVE ng/mL

## 2021-10-15 ENCOUNTER — Ambulatory Visit
Admission: RE | Admit: 2021-10-15 | Discharge: 2021-10-15 | Disposition: A | Discharge status: Home / Self Care | Payer: No Typology Code available for payment source | Source: Ambulatory Visit | Attending: Obstetrics and Gynecology | Admitting: Obstetrics and Gynecology

## 2021-10-15 DIAGNOSIS — Z3689 Encounter for other specified antenatal screening: Secondary | ICD-10-CM | POA: Diagnosis present

## 2021-10-15 DIAGNOSIS — Z3490 Encounter for supervision of normal pregnancy, unspecified, unspecified trimester: Secondary | ICD-10-CM | POA: Insufficient documentation

## 2021-10-15 DIAGNOSIS — Z3A17 17 weeks gestation of pregnancy: Secondary | ICD-10-CM | POA: Diagnosis not present

## 2021-10-16 LAB — URINE CULTURE

## 2021-10-16 LAB — CERVICOVAGINAL ANCILLARY ONLY
Bacterial Vaginitis (gardnerella): NEGATIVE
Chlamydia: NEGATIVE
Comment: NEGATIVE
Comment: NEGATIVE
Comment: NEGATIVE
Comment: NORMAL
Neisseria Gonorrhea: NEGATIVE
Trichomonas: NEGATIVE

## 2021-10-17 ENCOUNTER — Other Ambulatory Visit: Payer: Self-pay | Admitting: Obstetrics

## 2021-10-17 ENCOUNTER — Telehealth: Payer: Self-pay | Admitting: Obstetrics

## 2021-10-17 DIAGNOSIS — Z3480 Encounter for supervision of other normal pregnancy, unspecified trimester: Secondary | ICD-10-CM

## 2021-10-17 NOTE — Telephone Encounter (Signed)
Called pt to reschedule 5/5 appt.  Pt does not have [place] and her mailbox is not set up.  ?

## 2021-10-25 ENCOUNTER — Ambulatory Visit: Payer: No Typology Code available for payment source

## 2021-10-26 ENCOUNTER — Encounter: Payer: No Typology Code available for payment source | Admitting: Obstetrics

## 2021-10-31 ENCOUNTER — Ambulatory Visit (INDEPENDENT_AMBULATORY_CARE_PROVIDER_SITE_OTHER): Payer: No Typology Code available for payment source | Admitting: Obstetrics

## 2021-10-31 VITALS — BP 126/70 | Wt 162.0 lb

## 2021-10-31 DIAGNOSIS — Z3480 Encounter for supervision of other normal pregnancy, unspecified trimester: Secondary | ICD-10-CM

## 2021-10-31 DIAGNOSIS — Z3A20 20 weeks gestation of pregnancy: Secondary | ICD-10-CM

## 2021-10-31 NOTE — Progress Notes (Signed)
Routine Prenatal Care Visit ? ?Subjective  ?Pam Mitchell is a 31 y.o. G3P2002 at [redacted]w[redacted]d being seen today for ongoing prenatal care.  She is currently monitored for the following issues for this low-risk pregnancy and has Supervision of other normal pregnancy, antepartum on their problem list.  ?----------------------------------------------------------------------------------- ?Patient reports no complaints.   ?Contractions: Not present. Vag. Bleeding: None.  Movement: Present. Leaking Fluid denies.  ?----------------------------------------------------------------------------------- ?The following portions of the patient's history were reviewed and updated as appropriate: allergies, current medications, past family history, past medical history, past social history, past surgical history and problem list. Problem list updated. ? ?Objective  ?Blood pressure 126/70, weight 162 lb (73.5 kg), last menstrual period 07/31/2021. ?Pregravid weight Pregravid weight not on file Total Weight Gain Not found. ?Urinalysis: Urine Protein    Urine Glucose   ? ?Fetal Status:     Movement: Present    ? ?General:  Alert, oriented and cooperative. Patient is in no acute distress.  ?Skin: Skin is warm and dry. No rash noted.   ?Cardiovascular: Normal heart rate noted  ?Respiratory: Normal respiratory effort, no problems with respiration noted  ?Abdomen: Soft, gravid, appropriate for gestational age. Pain/Pressure: Absent     ?Pelvic:  Cervical exam deferred        ?Extremities: Normal range of motion.     ?Mental Status: Normal mood and affect. Normal behavior. Normal judgment and thought content.  ? ?Assessment  ? ?32 y.o. G3P2002 at [redacted]w[redacted]d by  03/14/2022, by Ultrasound presenting for routine prenatal visit ? ?Plan  ? ?third Problems (from 10/08/21 to present)   ? Problem Noted Resolved  ? Supervision of other normal pregnancy, antepartum 10/12/2021 by Mirna Mires, CNM No  ? Overview Signed 10/12/2021  3:03 PM by Mirna Mires, CNM  ?   ?Nursing Staff Provider  ?Office Location  [facility] Dating  Done by Jerene Pitch   ?Language  English Anatomy US    ?Flu Vaccine   Genetic Screen  NIPS:   ?TDaP vaccine    Hgb A1C or  ?GTT Early : ?Third trimester :   ?Covid    LAB RESULTS   ?Rhogam   Blood Type --/--/A POS (03/01 1524)   ?Feeding Plan  Antibody NEG (03/01 1524)  ?Contraception  Rubella    ?Circumcision  RPR     ?Pediatrician   HBsAg     ?Support Person  HIV    ?Prenatal Classes  Varicella   ?  GBS  (For PCN allergy, check sensitivities)   ?BTL Consent     ?VBAC Consent  Pap    ?  Hgb Electro    ?  CF   ?   SMA   ?     ? ? ?  ?  ?  ?  ? ?Preterm labor symptoms and general obstetric precautions including but not limited to vaginal bleeding, contractions, leaking of fluid and fetal movement were reviewed in detail with the patient. ?Please refer to After Visit Summary for other counseling recommendations.  ?Repeat anatomy scan is ordered for her due to incomplete views. ? ?No follow-ups on file. ? ?Mirna Mires, CNM  ?10/31/2021 3:21 PM   ? ?

## 2021-10-31 NOTE — Progress Notes (Signed)
No vb. No lof. Anatomy scan follow up  

## 2021-11-28 ENCOUNTER — Encounter: Payer: No Typology Code available for payment source | Admitting: Licensed Practical Nurse

## 2021-11-30 ENCOUNTER — Ambulatory Visit (INDEPENDENT_AMBULATORY_CARE_PROVIDER_SITE_OTHER): Payer: No Typology Code available for payment source | Admitting: Licensed Practical Nurse

## 2021-11-30 ENCOUNTER — Encounter: Payer: Self-pay | Admitting: Licensed Practical Nurse

## 2021-11-30 VITALS — BP 104/62 | Wt 166.6 lb

## 2021-11-30 DIAGNOSIS — O26843 Uterine size-date discrepancy, third trimester: Secondary | ICD-10-CM

## 2021-11-30 DIAGNOSIS — Z3480 Encounter for supervision of other normal pregnancy, unspecified trimester: Secondary | ICD-10-CM

## 2021-11-30 DIAGNOSIS — Z3A25 25 weeks gestation of pregnancy: Secondary | ICD-10-CM

## 2021-11-30 NOTE — Progress Notes (Signed)
Routine Prenatal Care Visit  Subjective  Pam Mitchell is a 31 y.o. G3P2002 at [redacted]w[redacted]d being seen today for ongoing prenatal care.  She is currently monitored for the following issues for this low-risk pregnancy and has Supervision of other normal pregnancy, antepartum on their problem list.  ----------------------------------------------------------------------------------- Patient reports heartburn.  Doing well.  Mood has been good. Contractions: Not present. Vag. Bleeding: None.  Movement: Present. Leaking Fluid denies.  ----------------------------------------------------------------------------------- The following portions of the patient's history were reviewed and updated as appropriate: allergies, current medications, past family history, past medical history, past social history, past surgical history and problem list. Problem list updated.  Objective  Blood pressure 104/62, weight 166 lb 9.6 oz (75.6 kg), last menstrual period 07/31/2021. Pregravid weight Pregravid weight not on file Total Weight Gain Not found. Urinalysis: Urine Protein    Urine Glucose    Fetal Status: Fetal Heart Rate (bpm): 140 Fundal Height: 20 cm Movement: Present     General:  Alert, oriented and cooperative. Patient is in no acute distress.  Skin: Skin is warm and dry. No rash noted.   Cardiovascular: Normal heart rate noted  Respiratory: Normal respiratory effort, no problems with respiration noted  Abdomen: Soft, gravid, appropriate for gestational age. Pain/Pressure: Absent     Pelvic:  Cervical exam deferred        Extremities: Normal range of motion.  Edema: None  Mental Status: Normal mood and affect. Normal behavior. Normal judgment and thought content.   Assessment   31 y.o. G3P2002 at [redacted]w[redacted]d by  03/14/2022, by Ultrasound presenting for routine prenatal visit  Plan   third Problems (from 10/08/21 to present)     Problem Noted Resolved   Supervision of other normal pregnancy, antepartum  10/12/2021 by Mirna Mires, CNM No   Overview Addendum 11/30/2021  4:14 PM by Ellwood Sayers, CNM     Nursing Staff Provider  Office Location  [facility] Dating  Done by [place]   Language  English Anatomy US    Flu Vaccine   Genetic Screen  NIPS:   TDaP vaccine    Hgb A1C or  GTT Early : Third trimester :   Covid    LAB RESULTS   Rhogam  NA Blood Type A/Positive/-- (04/21 1539)   Feeding Plan  Antibody Negative (04/21 1539)  Contraception BTL Rubella 3.40 (04/21 1539)  Circumcision yes RPR Non Reactive (04/21 1539)   Pediatrician   HBsAg Negative (04/21 1539)   Support Person  HIV Non Reactive (04/21 1539)  Prenatal Classes  Varicella     GBS  (For PCN allergy, check sensitivities)   BTL Consent     VBAC Consent  Pap      Hgb Electro      CF      SMA                    Preterm labor symptoms and general obstetric precautions including but not limited to vaginal bleeding, contractions, leaking of fluid and fetal movement were reviewed in detail with the patient. Please refer to After Visit Summary for other counseling recommendations.   Return in about 4 weeks (around 12/28/2021) for ROB, 28 wks lab.  S <D, needs repeat anatomy US-US ordered  Carie Caddy, CNM  [place] Pulse, [facility]  11/30/21  4:17 PM

## 2021-12-10 ENCOUNTER — Ambulatory Visit
Admission: RE | Admit: 2021-12-10 | Discharge: 2021-12-10 | Disposition: A | Discharge status: Home / Self Care | Payer: No Typology Code available for payment source | Source: Ambulatory Visit | Attending: Licensed Practical Nurse | Admitting: Licensed Practical Nurse

## 2021-12-10 DIAGNOSIS — O321XX0 Maternal care for breech presentation, not applicable or unspecified: Secondary | ICD-10-CM | POA: Diagnosis not present

## 2021-12-10 DIAGNOSIS — Z3480 Encounter for supervision of other normal pregnancy, unspecified trimester: Secondary | ICD-10-CM | POA: Insufficient documentation

## 2021-12-10 DIAGNOSIS — Z3687 Encounter for antenatal screening for uncertain dates: Secondary | ICD-10-CM | POA: Diagnosis present

## 2021-12-10 DIAGNOSIS — Z3A24 24 weeks gestation of pregnancy: Secondary | ICD-10-CM | POA: Insufficient documentation

## 2021-12-13 ENCOUNTER — Encounter: Payer: Self-pay | Admitting: Licensed Practical Nurse

## 2021-12-31 ENCOUNTER — Ambulatory Visit (INDEPENDENT_AMBULATORY_CARE_PROVIDER_SITE_OTHER): Payer: No Typology Code available for payment source | Admitting: Obstetrics

## 2021-12-31 ENCOUNTER — Encounter: Payer: No Typology Code available for payment source | Admitting: Obstetrics

## 2021-12-31 ENCOUNTER — Other Ambulatory Visit: Payer: No Typology Code available for payment source

## 2021-12-31 ENCOUNTER — Other Ambulatory Visit: Payer: Self-pay | Admitting: Obstetrics

## 2021-12-31 VITALS — BP 118/70 | Wt 169.0 lb

## 2021-12-31 DIAGNOSIS — Z3A29 29 weeks gestation of pregnancy: Secondary | ICD-10-CM

## 2021-12-31 DIAGNOSIS — Z3480 Encounter for supervision of other normal pregnancy, unspecified trimester: Secondary | ICD-10-CM

## 2021-12-31 NOTE — Progress Notes (Signed)
28 week labs today. No vb. No lof.  

## 2021-12-31 NOTE — Progress Notes (Signed)
Routine Prenatal Care Visit  Subjective  Pam Mitchell is a 31 y.o. G3P2002 at [redacted]w[redacted]d being seen today for ongoing prenatal care.  She is currently monitored for the following issues for this low-risk pregnancy and has Supervision of other normal pregnancy, antepartum on their problem list.  ----------------------------------------------------------------------------------- Patient reports no complaints.  She is having hr 28 week labs today. Contractions: Not present. Vag. Bleeding: None.  Movement: Present. Leaking Fluid denies.  ----------------------------------------------------------------------------------- The following portions of the patient's history were reviewed and updated as appropriate: allergies, current medications, past family history, past medical history, past social history, past surgical history and problem list. Problem list updated.  Objective  Blood pressure 118/70, weight 169 lb (76.7 kg), last menstrual period 07/31/2021. Pregravid weight Pregravid weight not on file Total Weight Gain Not found. Urinalysis: Urine Protein    Urine Glucose    Fetal Status:     Movement: Present     General:  Alert, oriented and cooperative. Patient is in no acute distress.  Skin: Skin is warm and dry. No rash noted.   Cardiovascular: Normal heart rate noted  Respiratory: Normal respiratory effort, no problems with respiration noted  Abdomen: Soft, gravid, appropriate for gestational age. Pain/Pressure: Absent     Pelvic:  Cervical exam deferred        Extremities: Normal range of motion.     Mental Status: Normal mood and affect. Normal behavior. Normal judgment and thought content.   Assessment   31 y.o. [id number] at [redacted]w[redacted]d by  03/14/2022, by Ultrasound presenting for routine prenatal visit  Plan   third Problems (from 10/08/21 to present)    Problem Noted Resolved   Supervision of other normal pregnancy, antepartum 10/12/2021 by Mirna Mires, CNM No   Overview Addendum  11/30/2021  4:14 PM by Ellwood Sayers, CNM     Nursing Staff Provider  Office Location  [facility] Dating  Done by Schuman   Language  English Anatomy US    Flu Vaccine   Genetic Screen  NIPS:   TDaP vaccine    Hgb A1C or  GTT Early : Third trimester :   Covid    LAB RESULTS   Rhogam  NA Blood Type A/Positive/-- (04/21 1539)   Feeding Plan  Antibody Negative (04/21 1539)  Contraception BTL Rubella 3.40 (04/21 1539)  Circumcision yes RPR Non Reactive (04/21 1539)   Pediatrician   HBsAg Negative (04/21 1539)   Support Person  HIV Non Reactive (04/21 1539)  Prenatal Classes  Varicella     GBS  (For PCN allergy, check sensitivities)   BTL Consent     VBAC Consent  Pap      Hgb Electro      CF      SMA                   Preterm labor symptoms and general obstetric precautions including but not limited to vaginal bleeding, contractions, leaking of fluid and fetal movement were reviewed in detail with the patient. Please refer to After Visit Summary for other counseling recommendations.   No follow-ups on file.  Mirna Mires, CNM  12/31/2021 4:58 PM

## 2022-01-01 LAB — 28 WEEK RH+PANEL
Basophils Absolute: 0.1 10*3/uL (ref 0.0–0.2)
Basos: 1 %
EOS (ABSOLUTE): 0.1 10*3/uL (ref 0.0–0.4)
Eos: 1 %
Gestational Diabetes Screen: 82 mg/dL (ref 70–139)
HIV Screen 4th Generation wRfx: NONREACTIVE
Hematocrit: 31.3 % — ABNORMAL LOW (ref 34.0–46.6)
Hemoglobin: 10.5 g/dL — ABNORMAL LOW (ref 11.1–15.9)
Immature Grans (Abs): 0 10*3/uL (ref 0.0–0.1)
Immature Granulocytes: 0 %
Lymphocytes Absolute: 1.8 10*3/uL (ref 0.7–3.1)
Lymphs: 20 %
MCH: 29.3 pg (ref 26.6–33.0)
MCHC: 33.5 g/dL (ref 31.5–35.7)
MCV: 87 fL (ref 79–97)
Monocytes Absolute: 0.5 10*3/uL (ref 0.1–0.9)
Monocytes: 5 %
Neutrophils Absolute: 6.6 10*3/uL (ref 1.4–7.0)
Neutrophils: 73 %
Platelets: 276 10*3/uL (ref 150–450)
RBC: 3.58 x10E6/uL — ABNORMAL LOW (ref 3.77–5.28)
RDW: 12.1 % (ref 11.7–15.4)
RPR Ser Ql: NONREACTIVE
WBC: 9 10*3/uL (ref 3.4–10.8)

## 2022-01-14 ENCOUNTER — Ambulatory Visit (INDEPENDENT_AMBULATORY_CARE_PROVIDER_SITE_OTHER): Payer: No Typology Code available for payment source | Admitting: Licensed Practical Nurse

## 2022-01-14 ENCOUNTER — Encounter: Payer: Self-pay | Admitting: Licensed Practical Nurse

## 2022-01-14 VITALS — BP 102/50 | Wt 169.6 lb

## 2022-01-14 DIAGNOSIS — Z3480 Encounter for supervision of other normal pregnancy, unspecified trimester: Secondary | ICD-10-CM

## 2022-01-14 DIAGNOSIS — Z3A31 31 weeks gestation of pregnancy: Secondary | ICD-10-CM

## 2022-01-14 LAB — POCT URINALYSIS DIPSTICK OB
Glucose, UA: NEGATIVE
POC,PROTEIN,UA: NEGATIVE

## 2022-01-14 NOTE — Progress Notes (Unsigned)
Routine Prenatal Care Visit  Subjective  Pam Mitchell is a 31 y.o. G3P2002 at [redacted]w[redacted]d being seen today for ongoing prenatal care.  She is currently monitored for the following issues for this low-risk pregnancy and has Supervision of other normal pregnancy, antepartum on their problem list.  ----------------------------------------------------------------------------------- Patient reports no complaints.  Here with Husband, feeling good.  They have limited PP support, but he will be off for about a week once the baby is born Desires BTL Contractions: Irritability. Vag. Bleeding: None.  Movement: Present. Leaking Fluid denies.  ----------------------------------------------------------------------------------- The following portions of the patient's history were reviewed and updated as appropriate: allergies, current medications, past family history, past medical history, past social history, past surgical history and problem list. Problem list updated.  Objective  Blood pressure (!) 102/50, weight 169 lb 9.6 oz (76.9 kg), last menstrual period 07/31/2021. Pregravid weight Pregravid weight not on file Total Weight Gain Not found. Urinalysis: Urine Protein    Urine Glucose    Fetal Status: Fetal Heart Rate (bpm): 140 Fundal Height: 30 cm Movement: Present     General:  Alert, oriented and cooperative. Patient is in no acute distress.  Skin: Skin is warm and dry. No rash noted.   Cardiovascular: Normal heart rate noted  Respiratory: Normal respiratory effort, no problems with respiration noted  Abdomen: Soft, gravid, appropriate for gestational age. Pain/Pressure: Absent     Pelvic:  Cervical exam deferred        Extremities: Normal range of motion.  Edema: None  Mental Status: Normal mood and affect. Normal behavior. Normal judgment and thought content.   Assessment   31 y.o. [id number] at [redacted]w[redacted]d by  03/14/2022, by Ultrasound presenting for routine prenatal visit  Plan   third Problems  (from 10/08/21 to present)     Problem Noted Resolved   Supervision of other normal pregnancy, antepartum 10/12/2021 by Mirna Mires, CNM No   Overview Addendum 01/04/2022 10:55 PM by Mirna Mires, CNM     Nursing Staff Provider  Office Location  [facility] Dating  Done by Schuman   Language  English Anatomy US    Flu Vaccine   Genetic Screen  NIPS:   TDaP vaccine    Hgb A1C or  GTT Early : Third trimester : 49  Covid    LAB RESULTS   Rhogam  NA Blood Type A/Positive/-- (04/21 1539)   Feeding Plan  Antibody Negative (04/21 1539)  Contraception BTL Rubella 3.40 (04/21 1539)  Circumcision yes RPR Non Reactive (04/21 1539)   Pediatrician   HBsAg Negative (04/21 1539)   Support Person  HIV Non Reactive (04/21 1539)  Prenatal Classes  Varicella     GBS  (For PCN allergy, check sensitivities)   BTL Consent     VBAC Consent  Pap      Hgb Electro      CF      SMA                    Preterm labor symptoms and general obstetric precautions including but not limited to vaginal bleeding, contractions, leaking of fluid and fetal movement were reviewed in detail with the patient. Please refer to After Visit Summary for other counseling recommendations.   Return in about 2 weeks (around 01/28/2022) for ROB.  Carie Caddy, CNM  Domingo Pulse, [facility]  01/14/22  4:38 PM

## 2022-01-28 ENCOUNTER — Encounter: Payer: No Typology Code available for payment source | Admitting: Obstetrics & Gynecology

## 2022-01-31 ENCOUNTER — Ambulatory Visit (INDEPENDENT_AMBULATORY_CARE_PROVIDER_SITE_OTHER): Payer: No Typology Code available for payment source | Admitting: Obstetrics & Gynecology

## 2022-01-31 VITALS — BP 100/60 | Wt 176.0 lb

## 2022-01-31 DIAGNOSIS — Z23 Encounter for immunization: Secondary | ICD-10-CM

## 2022-02-01 ENCOUNTER — Encounter: Payer: Self-pay | Admitting: Obstetrics & Gynecology

## 2022-02-13 ENCOUNTER — Ambulatory Visit (INDEPENDENT_AMBULATORY_CARE_PROVIDER_SITE_OTHER): Payer: No Typology Code available for payment source | Admitting: Obstetrics

## 2022-02-13 VITALS — BP 118/70 | Wt 173.0 lb

## 2022-02-13 DIAGNOSIS — Z113 Encounter for screening for infections with a predominantly sexual mode of transmission: Secondary | ICD-10-CM

## 2022-02-13 DIAGNOSIS — Z3480 Encounter for supervision of other normal pregnancy, unspecified trimester: Secondary | ICD-10-CM

## 2022-02-13 DIAGNOSIS — Z3A35 35 weeks gestation of pregnancy: Secondary | ICD-10-CM

## 2022-02-13 NOTE — Progress Notes (Signed)
Routine Prenatal Care Visit  Subjective  Pam Mitchell is a 31 y.o. G3P2002 at [redacted]w[redacted]d being seen today for ongoing prenatal care.  She is currently monitored for the following issues for this low-risk pregnancy and has Supervision of other normal pregnancy, antepartum on their problem list.  ----------------------------------------------------------------------------------- Patient reports no complaints.   Contractions: Not present. Vag. Bleeding: None.   . Leaking Fluid denies.  ----------------------------------------------------------------------------------- The following portions of the patient's history were reviewed and updated as appropriate: allergies, current medications, past family history, past medical history, past social history, past surgical history and problem list. Problem list updated.  Objective  Blood pressure 118/70, weight 173 lb (78.5 kg), last menstrual period 07/31/2021. Pregravid weight 150 lb (68 kg) Total Weight Gain 23 lb (10.4 kg) Urinalysis: Urine Protein    Urine Glucose    Fetal Status:           General:  Alert, oriented and cooperative. Patient is in no acute distress.  Skin: Skin is warm and dry. No rash noted.   Cardiovascular: Normal heart rate noted  Respiratory: Normal respiratory effort, no problems with respiration noted  Abdomen: Soft, gravid, appropriate for gestational age.       Pelvic:  Cervical exam deferred        Extremities: Normal range of motion.     Mental Status: Normal mood and affect. Normal behavior. Normal judgment and thought content.   Assessment   31 y.o. [id number] at [redacted]w[redacted]d by  03/14/2022, by Ultrasound presenting for routine prenatal visit  Plan   third Problems (from 10/08/21 to present)    Problem Noted Resolved   Supervision of other normal pregnancy, antepartum 10/12/2021 by Mirna Mires, CNM No   Overview Addendum 01/04/2022 10:55 PM by Mirna Mires, CNM     Nursing Staff Provider  Office Location   [facility] Dating  Done by Schuman   Language  English Anatomy US    Flu Vaccine   Genetic Screen  NIPS:   TDaP vaccine    Hgb A1C or  GTT Early : Third trimester : 55  Covid    LAB RESULTS   Rhogam  NA Blood Type A/Positive/-- (04/21 1539)   Feeding Plan  Antibody Negative (04/21 1539)  Contraception BTL Rubella 3.40 (04/21 1539)  Circumcision yes RPR Non Reactive (04/21 1539)   Pediatrician   HBsAg Negative (04/21 1539)   Support Person  HIV Non Reactive (04/21 1539)  Prenatal Classes  Varicella     GBS  (For PCN allergy, check sensitivities)   BTL Consent     VBAC Consent  Pap      Hgb Electro      CF      SMA                   Preterm labor symptoms and general obstetric precautions including but not limited to vaginal bleeding, contractions, leaking of fluid and fetal movement were reviewed in detail with the patient. Please refer to After Visit Summary for other counseling recommendations.  Her cultures are retrieved today.  Return in about 1 week (around 02/20/2022) for return OB.  Mirna Mires, CNM  02/13/2022 11:18 AM

## 2022-02-13 NOTE — Progress Notes (Signed)
GBS today. No vb. No lof.  

## 2022-02-15 LAB — GC/CHLAMYDIA PROBE AMP
Chlamydia trachomatis, NAA: NEGATIVE
Neisseria Gonorrhoeae by PCR: NEGATIVE

## 2022-02-17 LAB — CULTURE, BETA STREP (GROUP B ONLY): Strep Gp B Culture: NEGATIVE

## 2022-02-19 ENCOUNTER — Ambulatory Visit (INDEPENDENT_AMBULATORY_CARE_PROVIDER_SITE_OTHER): Payer: No Typology Code available for payment source | Admitting: Licensed Practical Nurse

## 2022-02-19 VITALS — BP 118/76 | Wt 175.0 lb

## 2022-02-19 DIAGNOSIS — Z3A36 36 weeks gestation of pregnancy: Secondary | ICD-10-CM

## 2022-02-19 DIAGNOSIS — O26843 Uterine size-date discrepancy, third trimester: Secondary | ICD-10-CM

## 2022-02-19 DIAGNOSIS — Z3480 Encounter for supervision of other normal pregnancy, unspecified trimester: Secondary | ICD-10-CM

## 2022-02-19 NOTE — Progress Notes (Signed)
Routine Prenatal Care Visit  Subjective  Pam Mitchell is a 31 y.o. G3P2002 at [redacted]w[redacted]d being seen today for ongoing prenatal care.  She is currently monitored for the following issues for this low-risk pregnancy and has Supervision of other normal pregnancy, antepartum on their problem list.  ----------------------------------------------------------------------------------- Patient reports no complaints.  Here with daughter.  Doping well.  Contractions: Not present. Vag. Bleeding: None.  Movement: Present. Leaking Fluid denies.  ----------------------------------------------------------------------------------- The following portions of the patient's history were reviewed and updated as appropriate: allergies, current medications, past family history, past medical history, past social history, past surgical history and problem list. Problem list updated.  Objective  Blood pressure 118/76, weight 175 lb (79.4 kg), last menstrual period 07/31/2021. Pregravid weight 150 lb (68 kg) Total Weight Gain 25 lb (11.3 kg) Urinalysis: Urine Protein    Urine Glucose    Fetal Status: Fetal Heart Rate (bpm): 145 Fundal Height: 33 cm Movement: Present     General:  Alert, oriented and cooperative. Patient is in no acute distress.  Skin: Skin is warm and dry. No rash noted.   Cardiovascular: Normal heart rate noted  Respiratory: Normal respiratory effort, no problems with respiration noted  Abdomen: Soft, gravid, appropriate for gestational age. Pain/Pressure: Absent     Pelvic:  Cervical exam deferred        Extremities: Normal range of motion.  Edema: None  Mental Status: Normal mood and affect. Normal behavior. Normal judgment and thought content.   Assessment   31 y.o. [id number] at [redacted]w[redacted]d by  03/14/2022, by Ultrasound presenting for routine prenatal visit  Plan   third Problems (from 10/08/21 to present)     Problem Noted Resolved   Supervision of other normal pregnancy, antepartum 10/12/2021 by  Mirna Mires, CNM No   Overview Addendum 02/18/2022  8:16 PM by Mirna Mires, CNM     Nursing Staff Provider  Office Location  [facility] Dating  Done by Schuman   Language  English Anatomy US  Normal female  Flu Vaccine   Genetic Screen  NIPS:   TDaP vaccine    Hgb A1C or  GTT Early : Third trimester : 41  Covid    LAB RESULTS   Rhogam  NA Blood Type A/Positive/-- (04/21 1539)   Feeding Plan  Antibody Negative (04/21 1539)  Contraception BTL Rubella 3.40 (04/21 1539)  Circumcision yes RPR Non Reactive (04/21 1539)   Pediatrician   HBsAg Negative (04/21 1539)   Support Person  HIV Non Reactive (04/21 1539)  Prenatal Classes  Varicella     GBS  (For PCN allergy, check sensitivities) negative  BTL Consent     VBAC Consent  Pap      Hgb Electro      CF      SMA                    Preterm labor symptoms and general obstetric precautions including but not limited to vaginal bleeding, contractions, leaking of fluid and fetal movement were reviewed in detail with the patient. Please refer to After Visit Summary for other counseling recommendations.   Growth [place] ordered, FH 33cm    Return in about 1 week (around 02/26/2022) for ROB. Carie Caddy, CNM  Domingo Pulse, [facility]  02/19/22  3:51 PM

## 2022-02-19 NOTE — Progress Notes (Signed)
No vb. No lof.  GBS at last visit, negative

## 2022-02-26 ENCOUNTER — Ambulatory Visit (INDEPENDENT_AMBULATORY_CARE_PROVIDER_SITE_OTHER): Payer: No Typology Code available for payment source | Admitting: Obstetrics & Gynecology

## 2022-02-26 VITALS — BP 100/60 | Wt 175.0 lb

## 2022-02-26 DIAGNOSIS — Z3A37 37 weeks gestation of pregnancy: Secondary | ICD-10-CM

## 2022-02-26 DIAGNOSIS — Z3480 Encounter for supervision of other normal pregnancy, unspecified trimester: Secondary | ICD-10-CM

## 2022-03-06 ENCOUNTER — Encounter: Payer: Self-pay | Admitting: Obstetrics & Gynecology

## 2022-03-06 ENCOUNTER — Ambulatory Visit (INDEPENDENT_AMBULATORY_CARE_PROVIDER_SITE_OTHER): Payer: No Typology Code available for payment source | Admitting: Obstetrics & Gynecology

## 2022-03-06 VITALS — BP 100/50 | Wt 178.6 lb

## 2022-03-06 DIAGNOSIS — Z3480 Encounter for supervision of other normal pregnancy, unspecified trimester: Secondary | ICD-10-CM

## 2022-03-06 NOTE — Progress Notes (Signed)
Subjective:    Pam Mitchell is a 31 y.o. female being seen today for her obstetrical visit. She is at [redacted]w[redacted]d gestation. Patient reports . Fetal movement: normal.  Menstrual History:no complaints OB History     Gravida  3   Para  2   Term  2   Preterm      AB      Living  2      SAB      IAB      Ectopic      Multiple  0   Live Births  2            Patient's last menstrual period was 07/31/2021.   The following portions of the patient's history were reviewed and updated as appropriate: allergies, current medications, past family history, past medical history, past social history, past surgical history, and problem list.   Review of Systems A comprehensive review of systems was negative.   Objective:    BP (!) 100/50   Wt 178 lb 9.6 oz (81 kg)   LMP 07/31/2021   BMI 28.83 kg/m  FHT: 142  Uterine Size: 38  Presentations: vertex  Pelvic Exam: deferred                         Assessment:    Pregnancy 38 and 6/7 weeks   Plan:   Plans for delivery: Vaginal anticipated Beta strep culture: negative Counseling:  labor precautioons Fetal testing: discussed Follow up in 1 Week.

## 2022-03-06 NOTE — Progress Notes (Signed)
142  

## 2022-03-13 ENCOUNTER — Encounter: Payer: Self-pay | Admitting: Licensed Practical Nurse

## 2022-03-13 ENCOUNTER — Ambulatory Visit (INDEPENDENT_AMBULATORY_CARE_PROVIDER_SITE_OTHER): Payer: No Typology Code available for payment source | Admitting: Licensed Practical Nurse

## 2022-03-13 VITALS — BP 120/60 | Wt 176.0 lb

## 2022-03-13 DIAGNOSIS — Z3480 Encounter for supervision of other normal pregnancy, unspecified trimester: Secondary | ICD-10-CM

## 2022-03-13 DIAGNOSIS — Z3A39 39 weeks gestation of pregnancy: Secondary | ICD-10-CM

## 2022-03-13 DIAGNOSIS — O26843 Uterine size-date discrepancy, third trimester: Secondary | ICD-10-CM

## 2022-03-13 LAB — POCT URINALYSIS DIPSTICK OB
Glucose, UA: NEGATIVE
POC,PROTEIN,UA: NEGATIVE

## 2022-03-13 NOTE — Progress Notes (Signed)
Routine Prenatal Care Visit  Subjective  Pam Mitchell is a 31 y.o. G3P2002 at [redacted]w[redacted]d being seen today for ongoing prenatal care.  She is currently monitored for the following issues for this low-risk pregnancy and has Supervision of other normal pregnancy, antepartum on their problem list.  ----------------------------------------------------------------------------------- Patient reports no complaints.  Doing well.  Desires IOL at 41 weeks, she has needed inductions with her previous 2 children  Declines sweep today .  Contractions: Not present. Vag. Bleeding: None.  Movement: Present. Leaking Fluid denies.  ----------------------------------------------------------------------------------- The following portions of the patient's history were reviewed and updated as appropriate: allergies, current medications, past family history, past medical history, past social history, past surgical history and problem list. Problem list updated.  Objective  Blood pressure 120/60, weight 176 lb (79.8 kg), last menstrual period 07/31/2021. Pregravid weight 150 lb (68 kg) Total Weight Gain 26 lb (11.8 kg) Urinalysis: Urine Protein Negative  Urine Glucose Negative  Fetal Status: Fetal Heart Rate (bpm): 138 Fundal Height: 34 cm Movement: Present  Presentation: Vertex  General:  Alert, oriented and cooperative. Patient is in no acute distress.  Skin: Skin is warm and dry. No rash noted.   Cardiovascular: Normal heart rate noted  Respiratory: Normal respiratory effort, no problems with respiration noted  Abdomen: Soft, gravid, appropriate for gestational age. Pain/Pressure: Present     Pelvic:  Cervical exam performed Dilation: 1 Effacement (%): 50 Station: -2  Extremities: Normal range of motion.  Edema: None  Mental Status: Normal mood and affect. Normal behavior. Normal judgment and thought content.   Assessment   31 y.o. [id number] at [redacted]w[redacted]d by  03/14/2022, by Ultrasound presenting for routine prenatal  visit  Plan   third Problems (from 10/08/21 to present)     Problem Noted Resolved   Supervision of other normal pregnancy, antepartum 10/12/2021 by Imagene Riches, CNM No   Overview Addendum 02/18/2022  8:16 PM by Imagene Riches, CNM     Nursing Staff Provider  Office Location  [facility] Dating  Done by Schuman   Language  English Anatomy US  Normal female  Flu Vaccine   Genetic Screen  NIPS:   TDaP vaccine    Hgb A1C or  GTT Early : Third trimester : 7  Covid    LAB RESULTS   Rhogam  NA Blood Type A/Positive/-- (04/21 1539)   Feeding Plan  Antibody Negative (04/21 1539)  Contraception BTL Rubella 3.40 (04/21 1539)  Circumcision yes RPR Non Reactive (04/21 1539)   Pediatrician   HBsAg Negative (04/21 1539)   Support Person  HIV Non Reactive (04/21 1539)  Prenatal Classes  Varicella     GBS  (For PCN allergy, check sensitivities) negative  BTL Consent     VBAC Consent  Pap      Hgb Electro      CF      SMA                    Term labor symptoms and general obstetric precautions including but not limited to vaginal bleeding, contractions, leaking of fluid and fetal movement were reviewed in detail with the patient. Please refer to After Visit Summary for other counseling recommendations.   IOL scheduled 9/28 at 0800, admission orders placed   [place] for AFI ordered   Return in about 1 week (around 03/20/2022) for ROB, NST.  Roberto Scales, [place], [facility]  03/15/22  4:44 PM

## 2022-03-14 ENCOUNTER — Encounter: Payer: Self-pay | Admitting: Licensed Practical Nurse

## 2022-03-19 ENCOUNTER — Ambulatory Visit (INDEPENDENT_AMBULATORY_CARE_PROVIDER_SITE_OTHER): Payer: No Typology Code available for payment source | Admitting: Obstetrics & Gynecology

## 2022-03-19 VITALS — BP 100/60 | Wt 178.0 lb

## 2022-03-19 DIAGNOSIS — Z3480 Encounter for supervision of other normal pregnancy, unspecified trimester: Secondary | ICD-10-CM

## 2022-03-19 DIAGNOSIS — Z3A40 40 weeks gestation of pregnancy: Secondary | ICD-10-CM

## 2022-03-19 NOTE — Progress Notes (Signed)
   PRENATAL VISIT NOTE  Subjective:  Pam Mitchell is a 31 y.o. G3P2002 at [redacted]w[redacted]d being seen today for ongoing prenatal care.  She is currently monitored for the following issues for this low-risk pregnancy and has Supervision of other normal pregnancy, antepartum on their problem list.  Patient reports no complaints.  Contractions: Not present. Vag. Bleeding: None.  Movement: Present. Denies leaking of fluid.   The following portions of the patient's history were reviewed and updated as appropriate: allergies, current medications, past family history, past medical history, past social history, past surgical history and problem list.   Objective:   Vitals:   03/19/22 1438  BP: 100/60  Weight: 178 lb (80.7 kg)    Fetal Status:     Movement: Present     General:  Alert, oriented and cooperative. Patient is in no acute distress.  Skin: Skin is warm and dry. No rash noted.   Cardiovascular: Normal heart rate noted  Respiratory: Normal respiratory effort, no problems with respiration noted  Abdomen: Soft, gravid, appropriate for gestational age.  Pain/Pressure: Absent     Pelvic: Cervical exam deferred      She declines a cervical exam.  Extremities: Normal range of motion.     Mental Status: Normal mood and affect. Normal behavior. Normal judgment and thought content.    Assessment and Plan:  Pregnancy: G3P2002 at [redacted]w[redacted]d Low risk pregnancy at 40.5 weeks. She has IOL scheduled in 2 days. Labor precautions reviewed.  Term labor symptoms and general obstetric precautions including but not limited to vaginal bleeding, contractions, leaking of fluid and fetal movement were reviewed in detail with the patient. Please refer to After Visit Summary for other counseling recommendations.   2 and 6 week pp visits  No future appointments.  Emily Filbert, MD

## 2022-03-21 ENCOUNTER — Encounter: Payer: Self-pay | Admitting: Obstetrics and Gynecology

## 2022-03-21 ENCOUNTER — Inpatient Hospital Stay: Payer: No Typology Code available for payment source | Admitting: Anesthesiology

## 2022-03-21 ENCOUNTER — Other Ambulatory Visit: Payer: Self-pay

## 2022-03-21 ENCOUNTER — Inpatient Hospital Stay
Admission: RE | Admit: 2022-03-21 | Discharge: 2022-03-22 | DRG: 798 | Disposition: A | Discharge status: Home / Self Care | Payer: No Typology Code available for payment source | Source: Ambulatory Visit | Attending: Obstetrics | Admitting: Obstetrics

## 2022-03-21 DIAGNOSIS — Z87891 Personal history of nicotine dependence: Secondary | ICD-10-CM

## 2022-03-21 DIAGNOSIS — O480 Post-term pregnancy: Principal | ICD-10-CM | POA: Diagnosis present

## 2022-03-21 DIAGNOSIS — Z23 Encounter for immunization: Secondary | ICD-10-CM | POA: Diagnosis not present

## 2022-03-21 DIAGNOSIS — Z3A41 41 weeks gestation of pregnancy: Secondary | ICD-10-CM

## 2022-03-21 DIAGNOSIS — Z302 Encounter for sterilization: Secondary | ICD-10-CM

## 2022-03-21 DIAGNOSIS — Z3480 Encounter for supervision of other normal pregnancy, unspecified trimester: Secondary | ICD-10-CM

## 2022-03-21 DIAGNOSIS — Z3490 Encounter for supervision of normal pregnancy, unspecified, unspecified trimester: Secondary | ICD-10-CM | POA: Diagnosis present

## 2022-03-21 LAB — CBC
HCT: 34.4 % — ABNORMAL LOW (ref 36.0–46.0)
Hemoglobin: 10.8 g/dL — ABNORMAL LOW (ref 12.0–15.0)
MCH: 26.2 pg (ref 26.0–34.0)
MCHC: 31.4 g/dL (ref 30.0–36.0)
MCV: 83.3 fL (ref 80.0–100.0)
Platelets: 248 10*3/uL (ref 150–400)
RBC: 4.13 MIL/uL (ref 3.87–5.11)
RDW: 13.2 % (ref 11.5–15.5)
WBC: 7.7 10*3/uL (ref 4.0–10.5)
nRBC: 0 % (ref 0.0–0.2)

## 2022-03-21 LAB — TYPE AND SCREEN
ABO/RH(D): A POS
Antibody Screen: NEGATIVE

## 2022-03-21 LAB — GROUP B STREP BY PCR: Group B strep by PCR: NEGATIVE

## 2022-03-21 MED ORDER — DOCUSATE SODIUM 100 MG PO CAPS
100.0000 mg | ORAL_CAPSULE | Freq: Two times a day (BID) | ORAL | Status: DC
  Administered 2022-03-21 – 2022-03-22 (×2): 100 mg via ORAL
  Filled 2022-03-21 (×2): qty 1

## 2022-03-21 MED ORDER — FERROUS SULFATE 325 (65 FE) MG PO TABS
325.0000 mg | ORAL_TABLET | Freq: Two times a day (BID) | ORAL | Status: DC
  Administered 2022-03-22 (×2): 325 mg via ORAL
  Filled 2022-03-21 (×2): qty 1

## 2022-03-21 MED ORDER — LACTATED RINGERS IV SOLN: 500.0000 mL | INTRAVENOUS | Status: DC | PRN

## 2022-03-21 MED ORDER — OXYTOCIN-SODIUM CHLORIDE 30-0.9 UT/500ML-% IV SOLN: 2.5000 [IU]/h | INTRAVENOUS | Status: DC | PRN

## 2022-03-21 MED ORDER — BUPIVACAINE HCL (PF) 0.25 % IJ SOLN
INTRAMUSCULAR | Status: DC | PRN
  Administered 2022-03-21: 3 mL via EPIDURAL
  Administered 2022-03-21: 4 mL via EPIDURAL

## 2022-03-21 MED ORDER — IBUPROFEN 600 MG PO TABS
600.0000 mg | ORAL_TABLET | Freq: Four times a day (QID) | ORAL | Status: DC
  Administered 2022-03-21 – 2022-03-22 (×4): 600 mg via ORAL
  Filled 2022-03-21 (×4): qty 1

## 2022-03-21 MED ORDER — AMMONIA AROMATIC IN INHA
RESPIRATORY_TRACT | Status: AC
  Filled 2022-03-21: qty 10

## 2022-03-21 MED ORDER — LACTATED RINGERS IV SOLN
INTRAVENOUS | Status: DC
  Administered 2022-03-21 (×2): 125 mL via INTRAVENOUS

## 2022-03-21 MED ORDER — OXYCODONE-ACETAMINOPHEN 5-325 MG PO TABS: 1.0000 | ORAL_TABLET | ORAL | Status: DC | PRN

## 2022-03-21 MED ORDER — LIDOCAINE HCL (PF) 1 % IJ SOLN
INTRAMUSCULAR | Status: DC | PRN
  Administered 2022-03-21: 3 mL

## 2022-03-21 MED ORDER — MISOPROSTOL 50MCG HALF TABLET: 50.0000 ug | ORAL_TABLET | ORAL | Status: DC

## 2022-03-21 MED ORDER — OXYTOCIN-SODIUM CHLORIDE 30-0.9 UT/500ML-% IV SOLN
2.5000 [IU]/h | INTRAVENOUS | Status: DC
  Administered 2022-03-21: 2.5 [IU]/h via INTRAVENOUS

## 2022-03-21 MED ORDER — EPHEDRINE 5 MG/ML INJ: 10.0000 mg | INTRAVENOUS | Status: DC | PRN

## 2022-03-21 MED ORDER — METHYLERGONOVINE MALEATE 0.2 MG/ML IJ SOLN: 0.2000 mg | INTRAMUSCULAR | Status: DC | PRN

## 2022-03-21 MED ORDER — MISOPROSTOL 25 MCG QUARTER TABLET
25.0000 ug | ORAL_TABLET | Freq: Once | ORAL | Status: AC
  Filled 2022-03-21: qty 1

## 2022-03-21 MED ORDER — LIDOCAINE-EPINEPHRINE (PF) 1.5 %-1:200000 IJ SOLN
INTRAMUSCULAR | Status: DC | PRN
  Administered 2022-03-21: 3 mL via EPIDURAL

## 2022-03-21 MED ORDER — OXYTOCIN 10 UNIT/ML IJ SOLN
INTRAMUSCULAR | Status: AC
  Filled 2022-03-21: qty 2

## 2022-03-21 MED ORDER — VARICELLA VIRUS VACCINE LIVE 1350 PFU/0.5ML IJ SUSR
0.5000 mL | INTRAMUSCULAR | Status: AC | PRN
  Administered 2022-03-22: 0.5 mL via SUBCUTANEOUS
  Filled 2022-03-21: qty 0.5

## 2022-03-21 MED ORDER — ONDANSETRON HCL 4 MG/2ML IJ SOLN: 4.0000 mg | Freq: Four times a day (QID) | INTRAMUSCULAR | Status: DC | PRN

## 2022-03-21 MED ORDER — BENZOCAINE-MENTHOL 20-0.5 % EX AERO
1.0000 | INHALATION_SPRAY | CUTANEOUS | Status: DC | PRN
  Administered 2022-03-21: 1 via TOPICAL
  Filled 2022-03-21: qty 56

## 2022-03-21 MED ORDER — LIDOCAINE HCL (PF) 1 % IJ SOLN
30.0000 mL | INTRAMUSCULAR | Status: DC | PRN
  Filled 2022-03-21: qty 30

## 2022-03-21 MED ORDER — MISOPROSTOL 25 MCG QUARTER TABLET
25.0000 ug | ORAL_TABLET | Freq: Once | ORAL | Status: DC
  Filled 2022-03-21: qty 1

## 2022-03-21 MED ORDER — FENTANYL-BUPIVACAINE-NACL 0.5-0.125-0.9 MG/250ML-% EP SOLN
EPIDURAL | Status: AC
  Filled 2022-03-21: qty 250

## 2022-03-21 MED ORDER — COCONUT OIL OIL: 1.0000 | TOPICAL_OIL | Status: DC | PRN

## 2022-03-21 MED ORDER — DIPHENHYDRAMINE HCL 50 MG/ML IJ SOLN: 12.5000 mg | INTRAMUSCULAR | Status: DC | PRN

## 2022-03-21 MED ORDER — PRENATAL MULTIVITAMIN CH
1.0000 | ORAL_TABLET | Freq: Every day | ORAL | Status: DC
  Administered 2022-03-22: 1 via ORAL
  Filled 2022-03-21 (×2): qty 1

## 2022-03-21 MED ORDER — SOD CITRATE-CITRIC ACID 500-334 MG/5ML PO SOLN: 30.0000 mL | ORAL | Status: DC | PRN

## 2022-03-21 MED ORDER — PHENYLEPHRINE 80 MCG/ML (10ML) SYRINGE FOR IV PUSH (FOR BLOOD PRESSURE SUPPORT): 80.0000 ug | PREFILLED_SYRINGE | INTRAVENOUS | Status: DC | PRN

## 2022-03-21 MED ORDER — OXYTOCIN-SODIUM CHLORIDE 30-0.9 UT/500ML-% IV SOLN
1.0000 m[IU]/min | INTRAVENOUS | Status: DC
  Administered 2022-03-21: 2 m[IU]/min via INTRAVENOUS
  Filled 2022-03-21: qty 1000

## 2022-03-21 MED ORDER — OXYTOCIN-SODIUM CHLORIDE 30-0.9 UT/500ML-% IV SOLN: 1.0000 m[IU]/min | INTRAVENOUS | Status: DC

## 2022-03-21 MED ORDER — TERBUTALINE SULFATE 1 MG/ML IJ SOLN: 0.2500 mg | Freq: Once | INTRAMUSCULAR | Status: DC | PRN

## 2022-03-21 MED ORDER — FENTANYL-BUPIVACAINE-NACL 0.5-0.125-0.9 MG/250ML-% EP SOLN
12.0000 mL/h | EPIDURAL | Status: DC | PRN
  Administered 2022-03-21: 12 mL/h via EPIDURAL

## 2022-03-21 MED ORDER — MISOPROSTOL 200 MCG PO TABS
ORAL_TABLET | ORAL | Status: AC
  Administered 2022-03-21: 50 ug via ORAL
  Filled 2022-03-21: qty 4

## 2022-03-21 MED ORDER — ACETAMINOPHEN 325 MG PO TABS
650.0000 mg | ORAL_TABLET | ORAL | Status: DC | PRN
  Administered 2022-03-21: 650 mg via ORAL
  Filled 2022-03-21: qty 2

## 2022-03-21 MED ORDER — SIMETHICONE 80 MG PO CHEW: 80.0000 mg | CHEWABLE_TABLET | ORAL | Status: DC | PRN

## 2022-03-21 MED ORDER — OXYTOCIN BOLUS FROM INFUSION
333.0000 mL | Freq: Once | INTRAVENOUS | Status: AC
  Administered 2022-03-21: 333 mL via INTRAVENOUS

## 2022-03-21 MED ORDER — LACTATED RINGERS IV SOLN
500.0000 mL | Freq: Once | INTRAVENOUS | Status: AC
  Administered 2022-03-21: 500 mL via INTRAVENOUS

## 2022-03-21 MED ORDER — DIPHENHYDRAMINE HCL 25 MG PO CAPS: 25.0000 mg | ORAL_CAPSULE | Freq: Four times a day (QID) | ORAL | Status: DC | PRN

## 2022-03-21 MED ORDER — FENTANYL CITRATE (PF) 100 MCG/2ML IJ SOLN
50.0000 ug | INTRAMUSCULAR | Status: DC | PRN
  Administered 2022-03-21: 50 ug via INTRAVENOUS
  Filled 2022-03-21: qty 2

## 2022-03-21 MED ORDER — METHYLERGONOVINE MALEATE 0.2 MG PO TABS: 0.2000 mg | ORAL_TABLET | ORAL | Status: DC | PRN

## 2022-03-21 NOTE — Progress Notes (Signed)
LABOR NOTE   SUBJECTIVE:   Pam Mitchell is a 31 y.o.  G3P2002  at [redacted]w[redacted]d whose labor is being induced for postdates. She has received one dose of misoprostol and has a Cook catheter in place. Fetal monitoring has shown occasional late decelerations that resolve with repositioning. [name] has received one dose of fentanyl is coping well with contractions.  Analgesia: IV pain meds, plans epidural  OBJECTIVE:  BP (!) 97/57 (BP Location: Left Arm)   Pulse 84   Temp 97.9 F (36.6 C) (Oral)   Resp 16   Ht 5\' 6"  (1.676 m)   Wt 80.3 kg   LMP 07/31/2021   BMI 28.57 kg/m  No intake/output data recorded.  SVE:   Dilation: 1.5 Effacement (%): 50 Station: -2 Exam by:: Jackee Glasner CNM CONTRACTIONS: irregular, every 3-5 minutes FHR: Fetal heart tracing reviewed. Baseline: 135 Variability: moderate, minimal Accelerations: present Decelerations: occasional lates  Category 2  Labs: Lab Results  Component Value Date   WBC 7.7 03/21/2022   HGB 10.8 (L) 03/21/2022   HCT 34.4 (L) 03/21/2022   MCV 83.3 03/21/2022   PLT 248 03/21/2022    ASSESSMENT: 1)  Cervical ripening     Coping: well     Membranes: intact  Active Problems:   Encounter for induction of labor  PLAN: Cervical ripening with low-dose Pitocin Cook catheter in place Anticipate NSVD   Lloyd Huger, CNM 03/21/2022 1:52 PM

## 2022-03-21 NOTE — Progress Notes (Signed)
LABOR NOTE   SUBJECTIVE:   Pam Mitchell is a 31 y.o.  G3P2002  at [redacted]w[redacted]d whose labor is being induced for postdates. Her Cook catheter came out about 2.5 hours ago. Her membranes ruptured spontaneously for clear fluid at 1610. She is comfortable with her epidural and making cervical change. She has been having some variables since SROM.   Analgesia: Epidural  OBJECTIVE:  BP 108/61   Pulse 90   Temp 98 F (36.7 C) (Oral)   Resp 16   Ht 5\' 6"  (1.676 m)   Wt 80.3 kg   LMP 07/31/2021   SpO2 100%   BMI 28.57 kg/m  No intake/output data recorded.  SVE:   Dilation: 6.5 Effacement (%): 90 Station: -1 Exam by:: Eswin Worrell CNM CONTRACTIONS: regular, every 2-3 minutes FHR: Fetal heart tracing reviewed. Baseline: 145 Variability: moderate Accelerations: present Decelerations:variable Category 2  Labs: Lab Results  Component Value Date   WBC 7.7 03/21/2022   HGB 10.8 (L) 03/21/2022   HCT 34.4 (L) 03/21/2022   MCV 83.3 03/21/2022   PLT 248 03/21/2022    ASSESSMENT: 1) Induction of labor due to postterm,  progressing well on pitocin     Coping:     Membranes: ruptured, clear fluid     Comfortable with epidural  Active Problems:   Encounter for induction of labor   PLAN: Continue present management Frequent position changes Consider amnioinfusion if variables do no resolve Anticipate NSVD Dr. Amalia Hailey updated  Lloyd Huger, CNM 03/21/2022 5:22 PM

## 2022-03-21 NOTE — Anesthesia Procedure Notes (Signed)
Epidural Patient location during procedure: OB Start time: 03/21/2022 4:43 PM End time: 03/21/2022 4:56 PM  Staffing Anesthesiologist: Arita Miss, MD Resident/CRNA: Norm Salt, CRNA Performed: resident/CRNA   Preanesthetic Checklist Completed: patient identified, IV checked, site marked, risks and benefits discussed, surgical consent, monitors and equipment checked and pre-op evaluation  Epidural Patient position: sitting Prep: ChloraPrep Patient monitoring: heart rate, continuous pulse ox and blood pressure Approach: midline Location: L3-L4 Injection technique: LOR saline  Needle:  Needle type: Tuohy  Needle gauge: 17 G Needle length: 9 cm and 9 Needle insertion depth: 6 cm Catheter type: closed end flexible Catheter size: 19 Gauge Catheter at skin depth: 11 cm Test dose: negative and 1.5% lidocaine with Epi 1:200 K  Assessment Events: blood not aspirated, injection not painful, no injection resistance, no paresthesia and negative IV test  Additional Notes 1 attempt Pt. Evaluated and documentation done after procedure finished. Patient identified. Risks/Benefits/Options discussed with patient including but not limited to bleeding, infection, nerve damage, paralysis, failed block, incomplete pain control, headache, blood pressure changes, nausea, vomiting, reactions to medication both or allergic, itching and postpartum back pain. Confirmed with bedside nurse the patient's most recent platelet count. Confirmed with patient that they are not currently taking any anticoagulation, have any bleeding history or any family history of bleeding disorders. Patient expressed understanding and wished to proceed. All questions were answered. Sterile technique was used throughout the entire procedure. Please see nursing notes for vital signs. Test dose was given through epidural catheter and negative prior to continuing to dose epidural or start infusion. Warning signs of high block given  to the patient including shortness of breath, tingling/numbness in hands, complete motor block, or any concerning symptoms with instructions to call for help. Patient was given instructions on fall risk and not to get out of bed. All questions and concerns addressed with instructions to call with any issues or inadequate analgesia.    Patient tolerated the insertion well without immediate complications.Reason for block:procedure for pain

## 2022-03-21 NOTE — Anesthesia Preprocedure Evaluation (Signed)
Anesthesia Evaluation  Patient identified by MRN, date of birth, ID band Patient awake    Reviewed: Allergy & Precautions, H&P , NPO status , Patient's Chart, lab work & pertinent test results  Airway Mallampati: II       Dental no notable dental hx.    Pulmonary neg pulmonary ROS, former smoker,    Pulmonary exam normal        Cardiovascular negative cardio ROS Normal cardiovascular exam     Neuro/Psych negative neurological ROS  negative psych ROS   GI/Hepatic negative GI ROS, Neg liver ROS,   Endo/Other  negative endocrine ROS  Renal/GU negative Renal ROS  negative genitourinary   Musculoskeletal   Abdominal   Peds  Hematology  (+) Blood dyscrasia, anemia ,   Anesthesia Other Findings   Reproductive/Obstetrics (+) Pregnancy                             Anesthesia Physical Anesthesia Plan  ASA: 2  Anesthesia Plan: Epidural   Post-op Pain Management:    Induction:   PONV Risk Score and Plan:   Airway Management Planned:   Additional Equipment:   Intra-op Plan:   Post-operative Plan:   Informed Consent:   Plan Discussed with: Anesthesiologist and CRNA  Anesthesia Plan Comments:         Anesthesia Quick Evaluation

## 2022-03-21 NOTE — H&P (Signed)
History and Physical   HPI  Pam Mitchell is a 31 y.o. G3P2002 at [redacted]w[redacted]d Estimated Date of Delivery: 03/14/22 who is being admitted for induction of labor for postdates. Her pregnancy has  been uncomplicated. She denies contractions, vaginal bleeding, and LOF.    OB History  OB History  Gravida Para Term Preterm AB Living  3 2 2  0 0 2  SAB IAB Ectopic Multiple Live Births  0 0 0 0 2    # Outcome Date GA Lbr Len/2nd Weight Sex Delivery Anes PTL Lv  3 Current           2 Term 09/01/19 [redacted]w[redacted]d 02:41 / 00:06 4100 g M Vag-Spont EPI  LIV     Name: Cortese,BOY Shalamar     Apgar1: 8  Apgar5: 9  1 Term 10/20/14 [redacted]w[redacted]d  3515 g F Vag-Spont   LIV     Name: Braelyn    PROBLEM LIST  Pregnancy complications or risks: Patient Active Problem List   Diagnosis Date Noted   Encounter for induction of labor 03/21/2022   Supervision of other normal pregnancy, antepartum 10/12/2021    Prenatal labs and studies: ABO, Rh: A/Positive/-- (04/21 1539) Antibody: Negative (04/21 1539) Rubella: 3.40 (04/21 1539) RPR: Non Reactive (07/10 1508)  HBsAg: Negative (04/21 1539)  HIV: Non Reactive (07/10 1508)  04-10-1972-- (08/23 1133)   Past Medical History:  Diagnosis Date   Anemia during pregnancy in third trimester 06/16/2019   Encounter for induction of labor 09/01/2019   Normal vaginal delivery 09/01/2019   Supervision of other normal pregnancy, antepartum 02/01/2019   [facility] Prenatal Labs Dating L=11 Blood type: A/Positive/-- (08/03 1518)  Genetic Screen Declined 8/18 Antibody:Negative (08/03 1518) Anatomic 10/03 Complete Rubella: 3.09 (08/03 1518) Varicella: Non-immune GTT  Third trimester:WNL  RPR: Non Reactive (08/03 1518)  Rhogam N/A HBsAg: Negative (08/03 1518)  TDaP vaccine  07/02/19   Flu Shot: 10/14 HIV: Non Reactive (08/03 1518)  Baby Food Breast     Supervision of other normal pregnancy, antepartum 02/01/2019   [facility] Prenatal Labs  Dating L=11 Blood type: A/Positive/--  (08/03 1518)   Genetic Screen Declined 8/18 Antibody:Negative (08/03 1518)  Anatomic 06-26-1997 Complete Rubella: 3.09 (08/03 1518) Varicella: Non-immune  GTT  Third trimester:WNL  RPR: Non Reactive (08/03 1518)   Rhogam N/A HBsAg: Negative (08/03 1518)   TDaP vaccine  07/02/19   Flu Shot: 10/14 HIV: Non Reactive (08/03 1518)   Baby Food B     Past Surgical History:  Procedure Laterality Date   TONSILLECTOMY AND ADENOIDECTOMY  2006     Medications    Current Discharge Medication List     CONTINUE these medications which have NOT CHANGED   Details  prenatal vitamin w/FE, FA (NATACHEW) 29-1 MG CHEW chewable tablet Chew 1 tablet by mouth daily at 12 noon.    ibuprofen (ADVIL) 200 MG tablet Take 400 mg by mouth every 6 (six) hours as needed for moderate pain or headache.         Allergies  Patient has no known allergies.  Review of Systems  Pertinent items noted in HPI and remainder of comprehensive ROS otherwise negative.  Physical Exam  BP 113/79 (BP Location: Left Arm)   Pulse 88   Temp 98 F (36.7 C) (Oral)   Resp 16   Ht 5\' 6"  (1.676 m)   Wt 80.3 kg   LMP 07/31/2021   BMI 28.57 kg/m   Lungs:  CTAB Cardio: RRR without M/R/G  Abd: Soft, gravid, NT Presentation: cephalic Extremities: no edema, normal pulses CERVIX: Dilation: 1.5 Effacement (%): 50 Station: -2 Exam by:: Norberto Sorenson CNM  See Prenatal records for more detailed PE.     FHR:  Baseline: 130 Variability: moderate Accelerations: present Decelerations: none Toco: irregular Category 1  Test Results  No results found for this or any previous visit (from the past 24 hour(s)). Other: GBS negative >5 weeks ago  Assessment   G3P2002 at [redacted]w[redacted]d Estimated Date of Delivery: 03/14/22  Reassuring maternal/fetal status.  Patient Active Problem List   Diagnosis Date Noted   Encounter for induction of labor 03/21/2022   Supervision of other normal pregnancy, antepartum 10/12/2021    Plan  1. Admit to L&D  for cervical ripening and IOL. 2. Cook catheter placed and misoprostol administered. 3. EFM: Category 1. May have intermittent monitoring 1 hour after misoprostol if Category 1. 4. Pharmacologic pain relief if desired.   5. Admission labs. GBS repeated. 6. Anticipate NSVD 7. Dr. Amalia Hailey notified of admission  Lloyd Huger, [facility] 03/21/2022 9:46 AM

## 2022-03-21 NOTE — Discharge Summary (Signed)
Postpartum Discharge Summary  Date of Service updated 03/22/2022     Patient Name: Pam Mitchell DOB: 11-16-1990 MRN: [id number]  Date of admission: 03/21/2022 Delivery date:03/21/2022  Delivering provider: Lurlean Horns  Date of discharge:   Admitting diagnosis: Encounter for induction of labor [Z34.90] Intrauterine pregnancy: [redacted]w[redacted]d    Secondary diagnosis:  Active Problems:   Postpartum care following vaginal delivery   Encounter for induction of labor  Additional problems: none    Discharge diagnosis: Preterm Pregnancy Delivered                                              Post partum procedures:postpartum tubal ligation Augmentation: Pitocin, Cytotec, and IP Foley Complications: None  Hospital course: Onset of Labor With Vaginal Delivery      31y.o. yo [id number] admitted in Latent Labor on 03/21/2022. Patient had an uncomplicated labor course as follows:  Membrane Rupture Time/Date: 4:10 PM ,03/21/2022   Delivery Method:Vaginal, Spontaneous  Episiotomy: None  Lacerations:  None  Patient had an uncomplicated postpartum course.  She is ambulating, tolerating a regular diet, passing flatus, and urinating well. Patient is discharged home in stable condition on 03/22/22.  Newborn Data: Birth date:03/21/2022  Birth time:5:57 PM  Gender:Female  Living status:Living  Apgars:9 ,9  Weight:3220 g   Magnesium Sulfate received: No BMZ received: No Rhophylac:N/A MMR:N/A T-DaP:Given prenatally Flu: No Transfusion:No  Physical exam  Vitals:   03/22/22 1238 03/22/22 1245 03/22/22 1316 03/22/22 1458  BP:  106/73 116/69 113/77  Pulse: 64 60 65 98  Resp: (!) _0 Temp:   98.7 F (37.1 C) 98.7 F (37.1 C)  TempSrc:   Oral Oral  SpO2: 100% 99% 99% 98%  Weight:      Height:       General: alert, cooperative, and no distress Lochia: appropriate Uterine Fundus: firm Incision: BTL incion is clean and dry,Intact DVT Evaluation: No evidence of DVT  seen on physical exam. Negative Homan's sign. No cords or calf tenderness. Labs: Lab Results  Component Value Date   WBC 11.7 (H) 03/22/2022   HGB 8.8 (L) 03/22/2022   HCT 28.0 (L) 03/22/2022   MCV 82.4 03/22/2022   PLT 228 03/22/2022       No data to display         Edinburgh Score:    03/22/2022    3:50 AM  Edinburgh Postnatal Depression Scale Screening Tool  I have been able to laugh and see the funny side of things. 0  I have looked forward with enjoyment to things. 0  I have blamed myself unnecessarily when things went wrong. 0  I have been anxious or worried for no good reason. 0  I have felt scared or panicky for no good reason. 0  Things have been getting on top of me. 0  I have been so unhappy that I have had difficulty sleeping. 0  I have felt sad or miserable. 0  I have been so unhappy that I have been crying. 0  The thought of harming myself has occurred to me. 0  Edinburgh Postnatal Depression Scale Total 0      After visit meds:  Allergies as of 03/22/2022   No Known Allergies      Medication List     STOP taking these  medications    ibuprofen 200 MG tablet Commonly known as: ADVIL       TAKE these medications    Fe Fum-Vit C-Vit B12-FA 200-250-0.01-1 MG Caps Take 1 tablet by mouth daily.   prenatal vitamin w/FE, FA 29-1 MG Chew chewable tablet Chew 1 tablet by mouth daily at 12 noon.         Discharge home in stable condition Infant Feeding: Breast Infant Disposition:home with mother Discharge instruction: per After Visit Summary and Postpartum booklet. Activity: Advance as tolerated. Pelvic rest for 6 weeks.  Diet: routine diet Anticipated Birth Control: BTL done PP Postpartum Appointment:6 weeks Additional Postpartum F/U: Postpartum Depression checkupin two weeks Future Appointments:No future appointments. Follow up Visit:  Follow-up Information     [place]. Schedule an appointment as soon as possible for a visit  in 2 week(s).   Why: Please call the office and make two appointments: one for 2 weeks past delivery for an incision check and then again at 6 weeks post delivery for a physical. Contact information: [place] [phone] [place], CNM  03/22/2022 6:33 PM

## 2022-03-22 ENCOUNTER — Other Ambulatory Visit: Payer: Self-pay | Admitting: Obstetrics

## 2022-03-22 ENCOUNTER — Encounter
Admission: RE | Disposition: A | Discharge status: Home / Self Care | Payer: Self-pay | Source: Ambulatory Visit | Attending: Obstetrics

## 2022-03-22 ENCOUNTER — Inpatient Hospital Stay: Payer: No Typology Code available for payment source | Admitting: Certified Registered Nurse Anesthetist

## 2022-03-22 ENCOUNTER — Encounter: Payer: Self-pay | Admitting: Obstetrics and Gynecology

## 2022-03-22 ENCOUNTER — Other Ambulatory Visit: Payer: Self-pay

## 2022-03-22 DIAGNOSIS — Z302 Encounter for sterilization: Secondary | ICD-10-CM | POA: Diagnosis not present

## 2022-03-22 DIAGNOSIS — D508 Other iron deficiency anemias: Secondary | ICD-10-CM

## 2022-03-22 HISTORY — PX: TUBAL LIGATION: SHX77

## 2022-03-22 LAB — CBC
HCT: 28 % — ABNORMAL LOW (ref 36.0–46.0)
Hemoglobin: 8.8 g/dL — ABNORMAL LOW (ref 12.0–15.0)
MCH: 25.9 pg — ABNORMAL LOW (ref 26.0–34.0)
MCHC: 31.4 g/dL (ref 30.0–36.0)
MCV: 82.4 fL (ref 80.0–100.0)
Platelets: 228 10*3/uL (ref 150–400)
RBC: 3.4 MIL/uL — ABNORMAL LOW (ref 3.87–5.11)
RDW: 13.2 % (ref 11.5–15.5)
WBC: 11.7 10*3/uL — ABNORMAL HIGH (ref 4.0–10.5)
nRBC: 0 % (ref 0.0–0.2)

## 2022-03-22 LAB — RPR: RPR Ser Ql: NONREACTIVE

## 2022-03-22 SURGERY — LIGATION, FALLOPIAN TUBE, POSTPARTUM
Anesthesia: General | Laterality: Bilateral

## 2022-03-22 MED ORDER — ACETAMINOPHEN 10 MG/ML IV SOLN
INTRAVENOUS | Status: AC
  Filled 2022-03-22: qty 100

## 2022-03-22 MED ORDER — PROPOFOL 10 MG/ML IV BOLUS
INTRAVENOUS | Status: DC | PRN
  Administered 2022-03-22: 140 mg via INTRAVENOUS

## 2022-03-22 MED ORDER — BUPIVACAINE HCL (PF) 0.5 % IJ SOLN
INTRAMUSCULAR | Status: AC
  Filled 2022-03-22: qty 30

## 2022-03-22 MED ORDER — FE FUM-VIT C-VIT B12-FA 200-250-0.01-1 MG PO CAPS: 1.0000 | ORAL_CAPSULE | Freq: Every day | ORAL | 1 refills | Status: AC

## 2022-03-22 MED ORDER — FENTANYL CITRATE (PF) 100 MCG/2ML IJ SOLN
INTRAMUSCULAR | Status: DC | PRN
  Administered 2022-03-22 (×2): 50 ug via INTRAVENOUS

## 2022-03-22 MED ORDER — SUCCINYLCHOLINE CHLORIDE 200 MG/10ML IV SOSY
PREFILLED_SYRINGE | INTRAVENOUS | Status: DC | PRN
  Administered 2022-03-22: 100 mg via INTRAVENOUS

## 2022-03-22 MED ORDER — FENTANYL CITRATE (PF) 100 MCG/2ML IJ SOLN
25.0000 ug | INTRAMUSCULAR | Status: DC | PRN
  Administered 2022-03-22 (×2): 25 ug via INTRAVENOUS

## 2022-03-22 MED ORDER — DEXTROSE IN LACTATED RINGERS 5 % IV SOLN: INTRAVENOUS | Status: DC

## 2022-03-22 MED ORDER — ROCURONIUM BROMIDE 10 MG/ML (PF) SYRINGE
PREFILLED_SYRINGE | INTRAVENOUS | Status: AC
  Filled 2022-03-22: qty 10

## 2022-03-22 MED ORDER — ONDANSETRON HCL 4 MG/2ML IJ SOLN: 4.0000 mg | Freq: Once | INTRAMUSCULAR | Status: DC | PRN

## 2022-03-22 MED ORDER — SUCCINYLCHOLINE CHLORIDE 200 MG/10ML IV SOSY
PREFILLED_SYRINGE | INTRAVENOUS | Status: AC
  Filled 2022-03-22: qty 10

## 2022-03-22 MED ORDER — ACETAMINOPHEN 10 MG/ML IV SOLN
INTRAVENOUS | Status: DC | PRN
  Administered 2022-03-22: 1000 mg via INTRAVENOUS

## 2022-03-22 MED ORDER — DEXAMETHASONE SODIUM PHOSPHATE 10 MG/ML IJ SOLN
INTRAMUSCULAR | Status: DC | PRN
  Administered 2022-03-22: 10 mg via INTRAVENOUS

## 2022-03-22 MED ORDER — ROCURONIUM BROMIDE 100 MG/10ML IV SOLN
INTRAVENOUS | Status: DC | PRN
  Administered 2022-03-22: 20 mg via INTRAVENOUS
  Administered 2022-03-22: 25 mg via INTRAVENOUS
  Administered 2022-03-22: 5 mg via INTRAVENOUS

## 2022-03-22 MED ORDER — SUGAMMADEX SODIUM 200 MG/2ML IV SOLN
INTRAVENOUS | Status: DC | PRN
  Administered 2022-03-22: 152.4 mg via INTRAVENOUS

## 2022-03-22 MED ORDER — ALBUTEROL SULFATE HFA 108 (90 BASE) MCG/ACT IN AERS
INHALATION_SPRAY | RESPIRATORY_TRACT | Status: DC | PRN
  Administered 2022-03-22: 4 via RESPIRATORY_TRACT

## 2022-03-22 MED ORDER — ONDANSETRON HCL 4 MG/2ML IJ SOLN
INTRAMUSCULAR | Status: DC | PRN
  Administered 2022-03-22: 4 mg via INTRAVENOUS

## 2022-03-22 MED ORDER — ONDANSETRON HCL 4 MG/2ML IJ SOLN: 4.0000 mg | Freq: Four times a day (QID) | INTRAMUSCULAR | Status: DC | PRN

## 2022-03-22 MED ORDER — MIDAZOLAM HCL 2 MG/2ML IJ SOLN
INTRAMUSCULAR | Status: DC | PRN
  Administered 2022-03-22: 2 mg via INTRAVENOUS

## 2022-03-22 MED ORDER — LIDOCAINE HCL (CARDIAC) PF 100 MG/5ML IV SOSY
PREFILLED_SYRINGE | INTRAVENOUS | Status: DC | PRN
  Administered 2022-03-22: 80 mg via INTRAVENOUS

## 2022-03-22 MED ORDER — MIDAZOLAM HCL 2 MG/2ML IJ SOLN
INTRAMUSCULAR | Status: AC
  Filled 2022-03-22: qty 2

## 2022-03-22 MED ORDER — PHENYLEPHRINE HCL (PRESSORS) 10 MG/ML IV SOLN
INTRAVENOUS | Status: DC | PRN
  Administered 2022-03-22: 80 ug via INTRAVENOUS

## 2022-03-22 MED ORDER — BUPIVACAINE HCL 0.5 % IJ SOLN
INTRAMUSCULAR | Status: DC | PRN
  Administered 2022-03-22: 6 mL

## 2022-03-22 MED ORDER — LACTATED RINGERS IV SOLN: INTRAVENOUS | Status: DC | PRN

## 2022-03-22 MED ORDER — FENTANYL CITRATE (PF) 100 MCG/2ML IJ SOLN
INTRAMUSCULAR | Status: AC
  Filled 2022-03-22: qty 2

## 2022-03-22 MED ORDER — ONDANSETRON 4 MG PO TBDP: 4.0000 mg | ORAL_TABLET | Freq: Four times a day (QID) | ORAL | Status: DC | PRN

## 2022-03-22 MED ORDER — KETOROLAC TROMETHAMINE 30 MG/ML IJ SOLN: 30.0000 mg | Freq: Once | INTRAMUSCULAR | Status: DC

## 2022-03-22 MED ORDER — KETOROLAC TROMETHAMINE 30 MG/ML IJ SOLN
INTRAMUSCULAR | Status: AC
  Filled 2022-03-22: qty 1

## 2022-03-22 SURGICAL SUPPLY — 34 items
BENZOIN TINCTURE PRP APPL 2/3 (GAUZE/BANDAGES/DRESSINGS) ×1 IMPLANT
BLADE SURG 15 STRL LF DISP TIS (BLADE) ×1 IMPLANT
BLADE SURG 15 STRL SS (BLADE) ×1
BNDG ADH 2 X3.75 FABRIC TAN LF (GAUZE/BANDAGES/DRESSINGS) ×1 IMPLANT
CHLORAPREP W/TINT 26 (MISCELLANEOUS) ×1 IMPLANT
CLIP FILSHIE TUBAL LIGA STRL (Clip) ×1 IMPLANT
DERMABOND ADVANCED .7 DNX6 (GAUZE/BANDAGES/DRESSINGS) IMPLANT
DRAPE LAPAROTOMY 77X122 PED (DRAPES) ×1 IMPLANT
DRSG TEGADERM 2-3/8X2-3/4 SM (GAUZE/BANDAGES/DRESSINGS) ×1 IMPLANT
DRSG TELFA 3X4 N-ADH STERILE (GAUZE/BANDAGES/DRESSINGS) ×1 IMPLANT
GAUZE 4X4 16PLY ~~LOC~~+RFID DBL (SPONGE) ×1 IMPLANT
GLOVE PI ORTHO PRO STRL 7.5 (GLOVE) ×1 IMPLANT
GOWN STRL REUS W/ TWL LRG LVL3 (GOWN DISPOSABLE) ×1 IMPLANT
GOWN STRL REUS W/ TWL XL LVL3 (GOWN DISPOSABLE) ×1 IMPLANT
GOWN STRL REUS W/TWL LRG LVL3 (GOWN DISPOSABLE) ×2
GOWN STRL REUS W/TWL XL LVL3 (GOWN DISPOSABLE)
KIT TURNOVER CYSTO (KITS) ×1 IMPLANT
LABEL OR SOLS (LABEL) ×1 IMPLANT
MANIFOLD NEPTUNE II (INSTRUMENTS) ×1 IMPLANT
NDL HYPO 25GX1X1/2 BEV (NEEDLE) ×1 IMPLANT
NEEDLE HYPO 25GX1X1/2 BEV (NEEDLE) ×1 IMPLANT
NS IRRIG 500ML POUR BTL (IV SOLUTION) ×1 IMPLANT
PACK BASIN MINOR ARMC (MISCELLANEOUS) ×1 IMPLANT
RETRACTOR RING XSMALL (MISCELLANEOUS) ×1 IMPLANT
RTRCTR WOUND ALEXIS 13CM XS SH (MISCELLANEOUS) ×1
SCRUB CHG 4% DYNA-HEX 4OZ (MISCELLANEOUS) ×1 IMPLANT
STRIP CLOSURE SKIN 1/2X4 (GAUZE/BANDAGES/DRESSINGS) ×1 IMPLANT
SUT VIC AB 4-0 PS2 18 (SUTURE) ×1 IMPLANT
SUT VICRYL 0 AB UR-6 (SUTURE) ×2 IMPLANT
SYR 10ML LL (SYRINGE) ×1 IMPLANT
TAPE SURG TRANSPORE 1 IN (GAUZE/BANDAGES/DRESSINGS) ×1 IMPLANT
TAPE SURGICAL TRANSPORE 1 IN (GAUZE/BANDAGES/DRESSINGS) ×1
TRAP FLUID SMOKE EVACUATOR (MISCELLANEOUS) ×1 IMPLANT
WATER STERILE IRR 500ML POUR (IV SOLUTION) ×1 IMPLANT

## 2022-03-22 NOTE — Anesthesia Preprocedure Evaluation (Signed)
Anesthesia Evaluation  Patient identified by MRN, date of birth, ID band Patient awake    Reviewed: Allergy & Precautions, H&P , NPO status , Patient's Chart, lab work & pertinent test results, reviewed documented beta blocker date and time   Airway Mallampati: II  TM Distance: >3 FB Neck ROM: full    Dental  (+) Teeth Intact   Pulmonary neg pulmonary ROS, former smoker,    Pulmonary exam normal        Cardiovascular Exercise Tolerance: Good negative cardio ROS Normal cardiovascular exam Rhythm:regular Rate:Normal     Neuro/Psych negative neurological ROS  negative psych ROS   GI/Hepatic negative GI ROS, Neg liver ROS,   Endo/Other  negative endocrine ROS  Renal/GU negative Renal ROS  negative genitourinary   Musculoskeletal   Abdominal   Peds  Hematology  (+) Blood dyscrasia, anemia ,   Anesthesia Other Findings Past Medical History: 06/16/2019: Anemia during pregnancy in third trimester 09/01/2019: Encounter for induction of labor 09/01/2019: Normal vaginal delivery 02/01/2019: Supervision of other normal pregnancy, antepartum     Comment:  [facility] Prenatal Labs Dating L=11 Blood type:               A/Positive/-- (08/03 1518)  Genetic Screen Declined               8/18 Antibody:Negative (08/03 1518) Anatomic               [place] Complete Rubella: 3.09 (08/03 1518) Varicella:               Non-immune GTT  Third trimester:WNL  RPR: Non Reactive               (08/03 1518)  Rhogam N/A HBsAg: Negative (08/03 1518)                TDaP vaccine  07/02/19   Flu Shot: 10/14 HIV: Non Reactive               (08/03 1518)  Baby Food Breast   02/01/2019: Supervision of other normal pregnancy, antepartum     Comment:  [facility] Prenatal Labs  Dating L=11 Blood type:               A/Positive/-- (08/03 1518)   Genetic Screen Declined 8/18              Antibody:Negative (08/03 1518)  Anatomic [place] Complete                Rubella: 3.09 (08/03 1518) Varicella: Non-immune  GTT                Third trimester:WNL  RPR: Non Reactive (08/03 1518)                 Rhogam N/A HBsAg: Negative (08/03 1518)   TDaP vaccine                07/02/19   Flu Shot: 10/14 HIV: Non Reactive (08/03 1518)                Baby Food B Past Surgical History: 2006: TONSILLECTOMY AND ADENOIDECTOMY BMI    Body Mass Index: 28.57 kg/m     Reproductive/Obstetrics negative OB ROS                             Anesthesia Physical Anesthesia Plan  ASA: 2  Anesthesia Plan: General ETT   Post-op Pain Management:  Induction:   PONV Risk Score and Plan:   Airway Management Planned:   Additional Equipment:   Intra-op Plan:   Post-operative Plan:   Informed Consent: I have reviewed the patients History and Physical, chart, labs and discussed the procedure including the risks, benefits and alternatives for the proposed anesthesia with the patient or authorized representative who has indicated his/her understanding and acceptance.     Dental Advisory Given  Plan Discussed with: CRNA  Anesthesia Plan Comments:         Anesthesia Quick Evaluation

## 2022-03-22 NOTE — Lactation Note (Signed)
This note was copied from a baby's chart. Lactation Consultation Note  Patient Name: Pam Mitchell [id number] Date: 03/22/2022 Reason for consult: Initial assessment;Term Age:29 hours  Maternal Data This is mom's 3rd baby, SVD. Mom with history of anemia in pregnancy. Mom's plan is breastfeeding and formula feeding combination.  Has patient been taught Hand Expression?: Yes Does the patient have breastfeeding experience prior to this delivery?: Yes How long did the patient breastfeed?: 1st child 1 month, 2nd child a few days.  Feeding Mother's Current Feeding Choice: Breast Milk and Formula   Lactation Tools Discussed/Used  Manual pump  Interventions Interventions: Breast feeding basics reviewed;Hand pump;Education  Discharge Discharge Education: Engorgement and breast care;Warning signs for feeding baby (Mom aware she can access [facility] lactation if she needs additional BF help once she goes home.) Pump: Manual (Mom did not have a breastpump at home. Provided manual harmony breastpump . Reviewed use,milk storage, and cleaning of parts.)  Consult Status Consult Status: PRN  Update provided to care nurse.  Pam Mitchell 03/22/2022, 3:01 PM

## 2022-03-22 NOTE — Progress Notes (Signed)
Patient discharged home with infant. Discharge instructions, prescriptions, and follow-up appointment were given to and reviewed with patient. Patient verbalized understanding. IV removed. Escorted out by [employer].

## 2022-03-22 NOTE — Transfer of Care (Signed)
Immediate Anesthesia Transfer of Care Note  Patient: Pam Mitchell  Procedure(s) Performed: POST PARTUM TUBAL LIGATION (Bilateral)  Patient Location: PACU  Anesthesia Type:General  Level of Consciousness: awake and alert   Airway & Oxygen Therapy: Patient Spontanous Breathing and Patient connected to face mask oxygen  Post-op Assessment: Report given to RN and Post -op Vital signs reviewed and stable  Post vital signs: Reviewed and stable  Last Vitals:  Vitals Value Taken Time  BP 104/66 03/22/22 1200  Temp    Pulse 68 03/22/22 1207  Resp 19 03/22/22 1207  SpO2 100 % 03/22/22 1207  Vitals shown include unvalidated device data.  Last Pain:  Vitals:   03/22/22 1039  TempSrc: Temporal  PainSc: 0-No pain      Patients Stated Pain Goal: 0 (29/19/16 6060)  Complications: No notable events documented.

## 2022-03-22 NOTE — Interval H&P Note (Signed)
History and Physical Interval Note:  03/22/2022 10:35 AM  Pam Mitchell  has presented today for surgery, with the diagnosis of Sterilization desired..  The various methods of treatment have been discussed with the patient and family. After consideration of risks, benefits and other options for treatment, the patient has consented to  Procedure(s): POST PARTUM TUBAL LIGATION (Bilateral) as a surgical intervention.  The patient's history has been reviewed, patient examined, no change in status, stable for surgery.  I have reviewed the patient's chart and labs.  Questions were answered to the patient's satisfaction.  Tubal We have discussed permanent sterilization in detail.  I have reviewed Filshie clip placement as a means of sterilization.  I have explained the risks and benefits of this procedure in detail.  I have stressed the fact that this is a permanent and not reversible procedure and there is a failure rate of 3 to7 per 1000 which is somewhat timing and method dependent.  I have discussed the possibility of inadvertent damage to bowel, bladder, blood vessels or other internal organs..  I have specifically discussed the risk of anesthesia, infection and blood loss with her.  We have discussed the possibility of laparotomy should there be a problem and the additional possibility of a longer hospital stay.  I have spoken with her regarding the recovery period, informing her that after this procedure, most people are performing their normal daily activities within one week.  I have recommended abstinence or another birth control method for 2-4 weeks following this procedure.  Other options of birth control have also been discussed.  The procedure of sterilization and alternate methods of birth control were discussed in detail with the patient.  I have answered all her questions and I believe that she has an adequate and informed understanding of the procedure.    Jeannie Fend MD

## 2022-03-22 NOTE — Op Note (Signed)
      OPERATIVE NOTE 03/22/2022 12:01 PM  PRE-OPERATIVE DIAGNOSIS:  1) Sterilization desired .  POST-OPERATIVE DIAGNOSIS:  * No Diagnosis Codes entered *  OPERATION:   Filshie clip tubal occlusion for sterilization post partum  SURGEON(S): Surgeon(s) and Role:    Harlin Heys, MD - Primary   ANESTHESIA: General  ESTIMATED BLOOD LOSS: 2 mL  SPECIMEN: * No specimens in log *  COMPLICATIONS: None  DISPOSITION: Stable to recovery room  DESCRIPTION OF PROCEDURE:      The patient was prepped and draped in the supine position and placed under general anesthesia. The bladder was emptied.  A small infraumbilical incision was made and we carefully dissected our way through the fascia and peritoneum with blunt and sharp dissection until we were within the abdominal cavity. Small right-angle retractors were placed. The right fallopian tube was identified and followed out to its fine fimbriated end and then back approximate half the way. At this point the section of tube was elevated on a Babcock clamp and Filshie clip was used to completely occlude the tube in a perpendicular manner. The left fallopian tube was similarly identified and again the midportion of the tube is completely occluded using a Filshie clip in a perpendicular manner. Hemostasis of the fallopian tubes was noted. The incision was closed with a deep suture through the fascia of 0 Vicryl followed by subcuticular closure of the skin. A long-acting anesthetic was injected.  Dermabond was applied.  The patient went to the recovery room in stable condition.   Finis Bud, M.D. 03/22/2022 12:01 PM

## 2022-03-22 NOTE — Anesthesia Procedure Notes (Signed)
Procedure Name: Intubation Date/Time: 03/22/2022 11:10 AM  Performed by: Demetrius Charity, CRNAPre-anesthesia Checklist: Patient identified, Patient being monitored, Timeout performed, Emergency Drugs available and Suction available Patient Re-evaluated:Patient Re-evaluated prior to induction Oxygen Delivery Method: Circle system utilized Preoxygenation: Pre-oxygenation with 100% oxygen Induction Type: IV induction Ventilation: Mask ventilation without difficulty Laryngoscope Size: 3 and McGraph Grade View: Grade I Tube type: Oral Tube size: 6.5 mm Number of attempts: 1 Airway Equipment and Method: Stylet Placement Confirmation: ETT inserted through vocal cords under direct vision, positive ETCO2 and breath sounds checked- equal and bilateral Secured at: 21 cm Tube secured with: Tape Dental Injury: Teeth and Oropharynx as per pre-operative assessment

## 2022-03-23 ENCOUNTER — Encounter: Payer: Self-pay | Admitting: Obstetrics and Gynecology

## 2022-03-23 NOTE — Progress Notes (Signed)
Subjective:    Pam Mitchell is a 31 y.o. female G3P3003 being seen today for her obstetrical visit. She is at [redacted]w[redacted]d gestation. Patient reports no complaints. Fetal movement: normal.  Menstrual History: OB History as of 03/22/2022     Gravida  3   Para  3   Term  3   Preterm      AB      Living  3      SAB      IAB      Ectopic      Multiple  0   Live Births  4           Menarche age: 76 Patient's last menstrual period was 07/31/2021.    The following portions of the patient's history were reviewed and updated as appropriate: allergies, current medications, past family history, past medical history, past social history, past surgical history, and problem list.  Review of Systems A comprehensive review of systems was negative.   Objective:    BP 100/60   Wt 176 lb (79.8 kg)   LMP 07/31/2021   BMI 28.41 kg/m  FHT:  140 cm BPM  Uterine Size: 140 cm  Presentation: cephalic     Assessment:    Pregnancy 34 and 0/7 weeks   Plan:    28-week labs reviewed, normal Labor precautions Follow up in 2 Weeks.    Rosario Adie, MD  03/23/2022 12:37 PM

## 2022-03-25 NOTE — Anesthesia Postprocedure Evaluation (Signed)
Anesthesia Post Note  Patient: Pam Mitchell  Procedure(s) Performed: POST PARTUM TUBAL LIGATION (Bilateral)  Patient location during evaluation: PACU Anesthesia Type: General Level of consciousness: awake and alert Pain management: pain level controlled Vital Signs Assessment: post-procedure vital signs reviewed and stable Respiratory status: spontaneous breathing, nonlabored ventilation, respiratory function stable and patient connected to nasal cannula oxygen Cardiovascular status: blood pressure returned to baseline and stable Postop Assessment: no apparent nausea or vomiting Anesthetic complications: no   No notable events documented.   Last Vitals:  Vitals:   03/22/22 1316 03/22/22 1458  BP: 116/69 113/77  Pulse: 65 98  Resp: 17 18  Temp: 37.1 C 37.1 C  SpO2: 99% 98%    Last Pain:  Vitals:   03/22/22 1458  TempSrc: Oral  PainSc:                  [name]

## 2022-03-28 ENCOUNTER — Encounter: Payer: Self-pay | Admitting: Obstetrics & Gynecology

## 2022-04-01 NOTE — Anesthesia Postprocedure Evaluation (Signed)
Anesthesia Post Note  Patient: Pam Mitchell  Procedure(s) Performed: AN AD HOC LABOR EPIDURAL  Anesthesia Type: Epidural Anesthetic complications: no Comments: Patient discharged prior to being evaluated in person. Vitals, nursing records, and discharge records reviewed with no documented anesthetic issues.   No notable events documented.   Last Vitals: There were no vitals filed for this visit.  Last Pain: There were no vitals filed for this visit.               Arita Miss

## 2022-04-04 ENCOUNTER — Encounter: Payer: Self-pay | Admitting: Obstetrics and Gynecology

## 2022-04-04 ENCOUNTER — Ambulatory Visit (INDEPENDENT_AMBULATORY_CARE_PROVIDER_SITE_OTHER): Payer: PRIVATE HEALTH INSURANCE | Admitting: Obstetrics and Gynecology

## 2022-04-04 VITALS — BP 105/69 | HR 103 | Ht 66.0 in | Wt 158.1 lb

## 2022-04-04 DIAGNOSIS — Z9889 Other specified postprocedural states: Secondary | ICD-10-CM

## 2022-04-04 NOTE — Progress Notes (Signed)
Patient presents today for 2 week postpartum follow-up. She had a vaginal delivery on 03/21/22 and a BTL on 03/22/22.  Patient is bottle feeding. She has had a BTL for birth control. EPDS score of 2. Patient states no other questions or concerns at this time.

## 2022-04-04 NOTE — Progress Notes (Signed)
HPI:      [name] is a 31 y.o. [phone] who LMP was No LMP recorded.  Subjective:   She presents today approximately 2 weeks from delivery and postpartum sterilization procedure.  She reports she is doing well.  She is not having any further pain from her incision.  She says she is having minimal vaginal bleeding at this time.    Hx: The following portions of the patient's history were reviewed and updated as appropriate:             She  has a past medical history of Anemia during pregnancy in third trimester (06/16/2019), Encounter for induction of labor (09/01/2019), Normal vaginal delivery (09/01/2019), Supervision of other normal pregnancy, antepartum (02/01/2019), and Supervision of other normal pregnancy, antepartum (02/01/2019). She does not have any pertinent problems on file. She  has a past surgical history that includes Tonsillectomy and adenoidectomy (2006) and Tubal ligation (Bilateral, 03/22/2022). Her family history includes Ovarian cancer in her paternal grandmother. She  reports that she has quit smoking. Her smoking use included cigarettes. She has never used smokeless tobacco. She reports that she does not currently use alcohol. She reports that she does not currently use drugs. She has a current medication list which includes the following prescription(s): fe fum-vit c-vit b12-fa. She has No Known Allergies.       Review of Systems:  Review of Systems  Constitutional: Denied constitutional symptoms, night sweats, recent illness, fatigue, fever, insomnia and weight loss.  Eyes: Denied eye symptoms, eye pain, photophobia, vision change and visual disturbance.  Ears/Nose/Throat/Neck: Denied ear, nose, throat or neck symptoms, hearing loss, nasal discharge, sinus congestion and sore throat.  Cardiovascular: Denied cardiovascular symptoms, arrhythmia, chest pain/pressure, edema, exercise intolerance, orthopnea and palpitations.  Respiratory: Denied pulmonary symptoms,  asthma, pleuritic pain, productive sputum, cough, dyspnea and wheezing.  Gastrointestinal: Denied, gastro-esophageal reflux, melena, nausea and vomiting.  Genitourinary: Denied genitourinary symptoms including symptomatic vaginal discharge, pelvic relaxation issues, and urinary complaints.  Musculoskeletal: Denied musculoskeletal symptoms, stiffness, swelling, muscle weakness and myalgia.  Dermatologic: Denied dermatology symptoms, rash and scar.  Neurologic: Denied neurology symptoms, dizziness, headache, neck pain and syncope.  Psychiatric: Denied psychiatric symptoms, anxiety and depression.  Endocrine: Denied endocrine symptoms including hot flashes and night sweats.   Meds:   Current Outpatient Medications on File Prior to Visit  Medication Sig Dispense Refill   Fe Fum-Vit C-Vit B12-FA 200-250-0.01-1 MG CAPS Take 1 tablet by mouth daily. 30 capsule 1   No current facility-administered medications on file prior to visit.      Objective:     Vitals:   04/04/22 1146  BP: 105/69  Pulse: (!) 103   Filed Weights   04/04/22 1146  Weight: 158 lb 1.6 oz (71.7 kg)           Abdomen: Soft.  Non-tender.  No masses.  No HSM.  Incision/s: Intact.  Healing well.  No erythema.  No drainage.     Assessment:    G3P3003 Patient Active Problem List   Diagnosis Date Noted   Encounter for induction of labor 03/21/2022   Supervision of other normal pregnancy, antepartum 10/12/2021   Postpartum care following vaginal delivery 09/01/2019     1. Postoperative state   2. Postpartum care following vaginal delivery     Patient with excellent recovery postpartum and postop.   Plan:            1.  Follow-up in 4 weeks for postpartum check  Orders No orders of the defined types were placed in this encounter.   No orders of the defined types were placed in this encounter.     F/U  Return in about 4 weeks (around 05/02/2022).  Finis Bud, M.D. 04/04/2022 12:13 PM

## 2022-05-08 ENCOUNTER — Ambulatory Visit (INDEPENDENT_AMBULATORY_CARE_PROVIDER_SITE_OTHER): Payer: No Typology Code available for payment source | Admitting: Obstetrics

## 2022-05-08 ENCOUNTER — Encounter: Payer: Self-pay | Admitting: Obstetrics

## 2022-05-08 DIAGNOSIS — Z23 Encounter for immunization: Secondary | ICD-10-CM | POA: Diagnosis not present

## 2022-05-08 DIAGNOSIS — E611 Iron deficiency: Secondary | ICD-10-CM | POA: Diagnosis not present

## 2022-05-08 NOTE — Progress Notes (Signed)
Post Partum Visit Note  Pam Mitchell is a 31 y.o. G17P3003 female who presents for a postpartum visit. She is 6 weeks postpartum following a normal spontaneous vaginal delivery.  I have fully reviewed the prenatal and intrapartum course and was present at the birth. The birth was at 28 gestational weeks.  Anesthesia: epidural. Postpartum course has been uncomplicated. Baby is doing well. Baby is feeding by bottle - Gerber Soy . Her bleeding lasted a little less than a month. Menses resumed today. Bowel function is normal. Bladder function is normal. Patient is not sexually active. Contraception method is tubal ligation. Postpartum depression screening: negative.     Edinburgh Postnatal Depression Scale - 05/08/22 1529       Edinburgh Postnatal Depression Scale:  In the Past 7 Days   I have been able to laugh and see the funny side of things. 0    I have looked forward with enjoyment to things. 0    I have blamed myself unnecessarily when things went wrong. 0    I have been anxious or worried for no good reason. 0    I have felt scared or panicky for no good reason. 0    Things have been getting on top of me. 1    I have been so unhappy that I have had difficulty sleeping. 0    I have felt sad or miserable. 0    I have been so unhappy that I have been crying. 0    The thought of harming myself has occurred to me. 0    Edinburgh Postnatal Depression Scale Total 1             Health Maintenance Due  Topic Date Due   COVID-19 Vaccine (1) Never done    The following portions of the patient's history were reviewed and updated as appropriate: allergies, current medications, past family history, past medical history, past social history, past surgical history, and problem list.  Review of Systems Pertinent items noted in HPI and remainder of comprehensive ROS otherwise negative.  Objective:  BP 105/70   Pulse 70   Ht 5\' 6"  (1.676 m)   Wt 185 lb (83.9 kg)   LMP  (LMP  Unknown)   Breastfeeding No   BMI 29.86 kg/m    General:  alert, cooperative, and appears stated age   Breasts:  not indicated  Lungs: clear to auscultation bilaterally  Heart:  regular rate and rhythm, S1, S2 normal, no murmur, click, rub or gallop  Abdomen: soft, non-tender; bowel sounds normal; no masses,  no organomegaly . Fundus not palpable.  Wound : N/A  GU exam:  normal       Assessment:    1. Need for immunization against influenza Flu   Normal postpartum exam.   Plan:   Essential components of care per ACOG recommendations:  1.  Mood and well being: Patient with negative depression screening today. Reviewed local resources for support.  - Patient tobacco use? No.   - hx of drug use? No.    2. Infant care and feeding:  -Patient currently breastmilk feeding? No.  -Social determinants of health (SDOH) reviewed in EPIC. No concerns. 3. Sexuality, contraception and birth spacing - Patient does not want a pregnancy in the next year.  Desired family size is 3 children.  - Reviewed reproductive life planning. Reviewed contraceptive methods based on pt preferences and effectiveness.  Patient desired Female Sterilization today.   - Discussed birth  spacing of 18 months  4. Sleep and fatigue -Encouraged family/partner/community support of 4 hrs of uninterrupted sleep to help with mood and fatigue  5. Physical Recovery  - Discussed patients delivery and complications. She describes her labor as good. - Patient had an uncomplicated vaginal birth with no laceration. Perineal healing reviewed. Patient expressed understanding - Patient has urinary incontinence? No. - Patient is safe to resume physical and sexual activity  6.  Health Maintenance - HM due items addressed Yes - Last pap smear  Diagnosis  Date Value Ref Range Status  11/14/2020   Final   - Negative for intraepithelial lesion or malignancy (NILM)   Pap smear not done at today's visit.  -Breast Cancer  screening indicated? No.   7. Chronic Disease/Pregnancy Condition follow up: Anemia. CBC today.  - PCP follow up  Glenetta Borg, CNM [facility]

## 2022-05-09 LAB — CBC
Hematocrit: 34.8 % (ref 34.0–46.6)
Hemoglobin: 11.1 g/dL (ref 11.1–15.9)
MCH: 26.1 pg — ABNORMAL LOW (ref 26.6–33.0)
MCHC: 31.9 g/dL (ref 31.5–35.7)
MCV: 82 fL (ref 79–97)
Platelets: 333 10*3/uL (ref 150–450)
RBC: 4.26 x10E6/uL (ref 3.77–5.28)
RDW: 15 % (ref 11.7–15.4)
WBC: 7.3 10*3/uL (ref 3.4–10.8)

## 2022-05-18 ENCOUNTER — Encounter: Payer: Self-pay | Admitting: Obstetrics

## 2023-05-15 ENCOUNTER — Ambulatory Visit: Payer: No Typology Code available for payment source | Admitting: Obstetrics

## 2023-05-15 DIAGNOSIS — Z0000 Encounter for general adult medical examination without abnormal findings: Secondary | ICD-10-CM

## 2023-11-11 IMAGING — US US OB COMP +14 WK
1 series · 15 of 28 positions shown · non-contrast
Comparison: none

CLINICAL DATA: Second trimester pregnancy for fetal anatomy survey.

EXAM:
OBSTETRICAL ULTRASOUND >14 WKS

[Series 1: us ob comp + 14 wk · 15 of 84 slices shown]
[im 1/84]
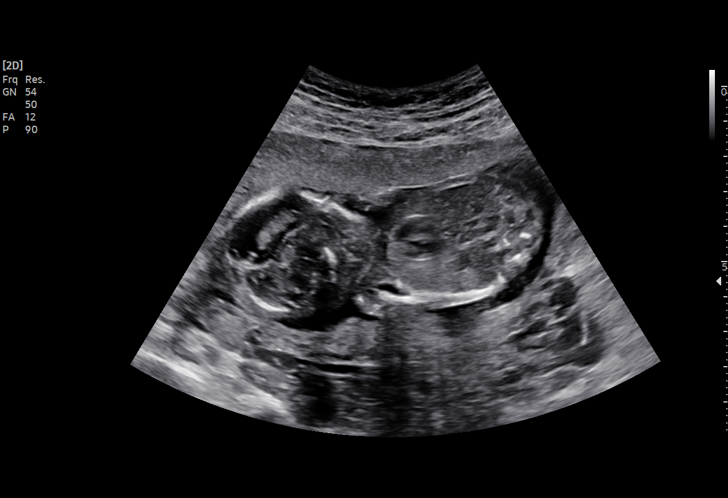
[im 7/84]
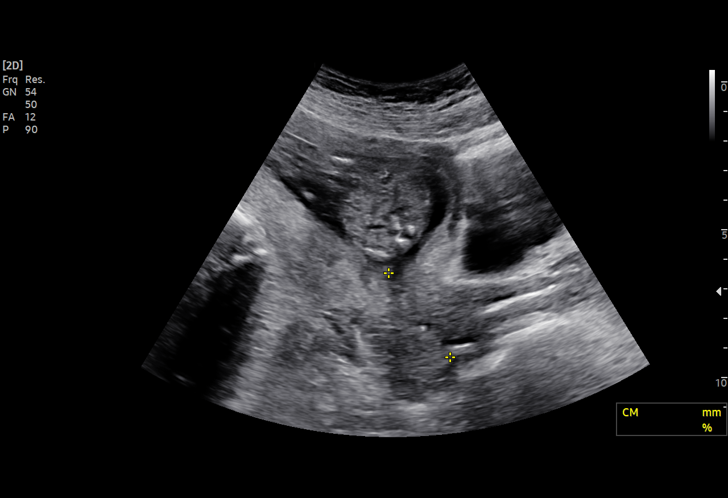
[im 13/84]
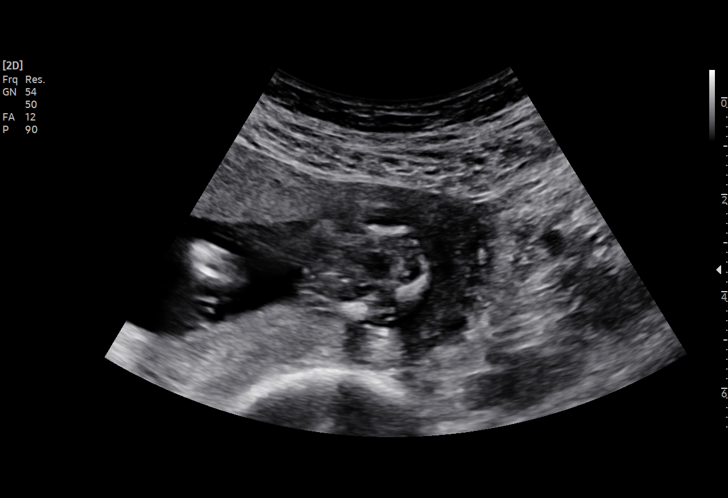
[im 19/84]
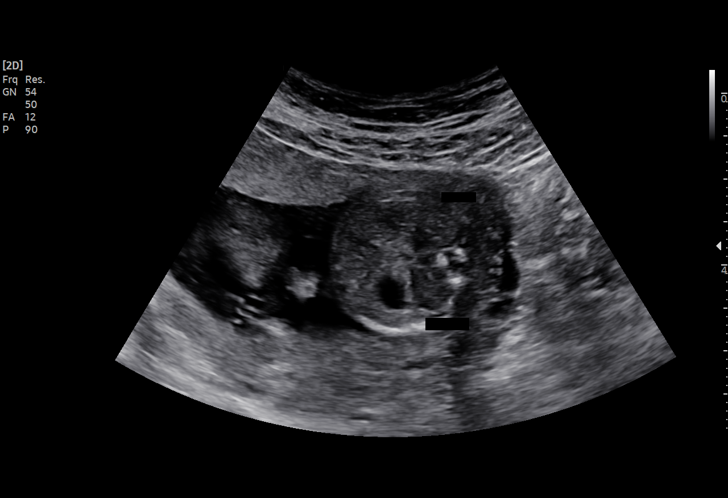
[im 25/84]
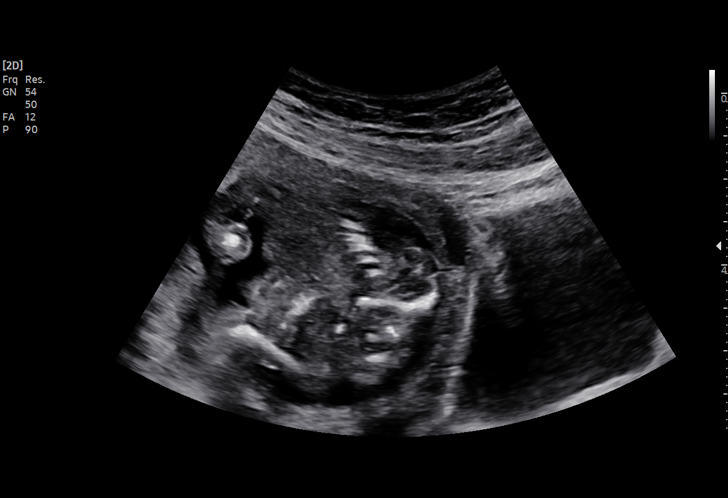
[im 31/84]
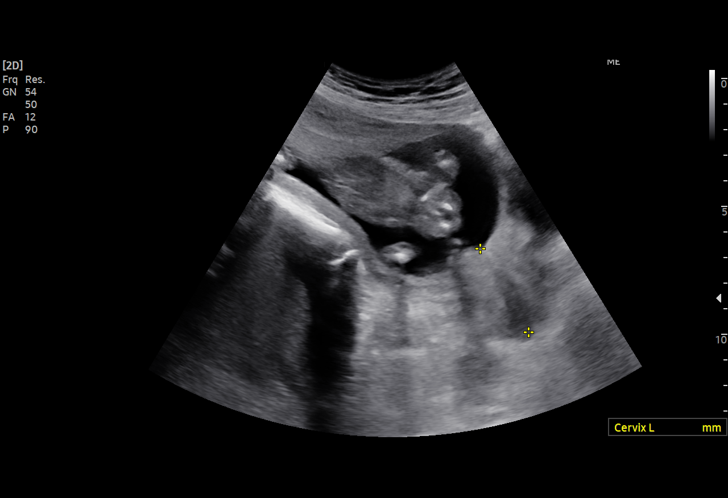
[im 37/84]
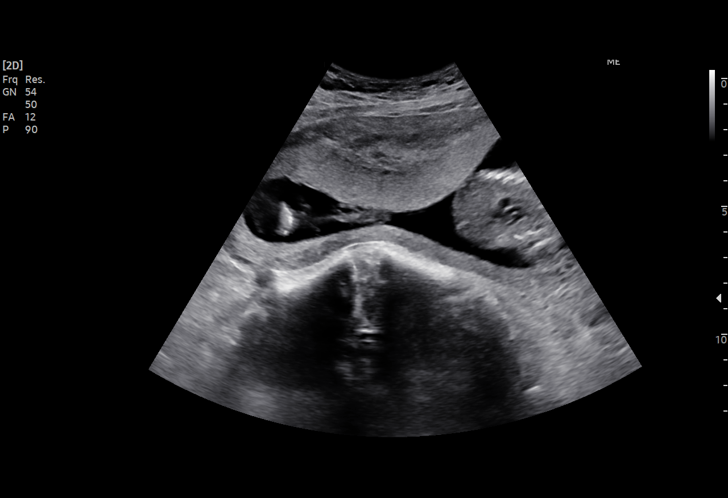
[im 44/84]
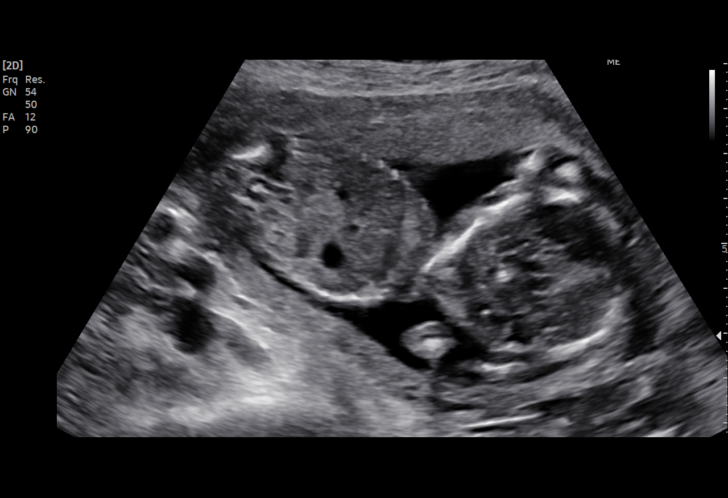
[im 47/84]
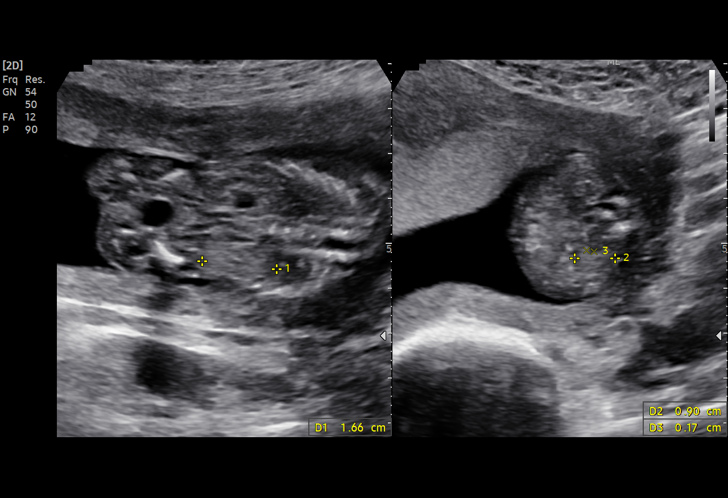
[im 53/84]
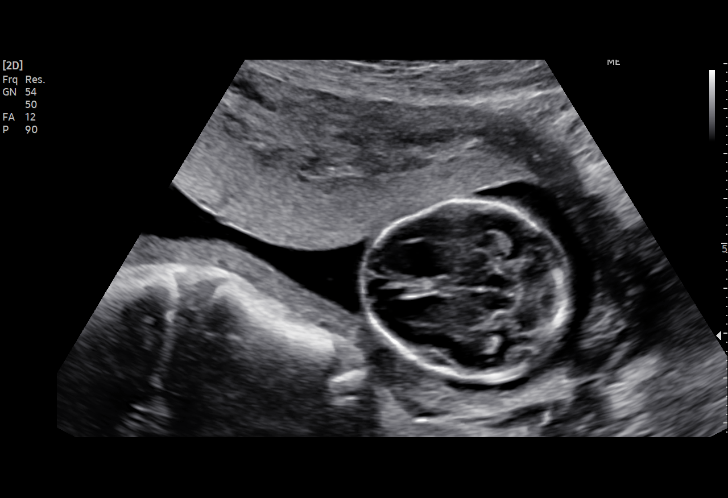
[im 59/84]
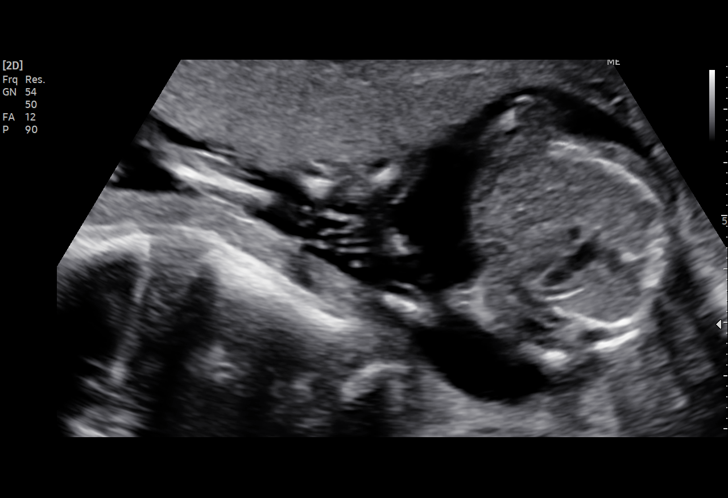
[im 65/84]
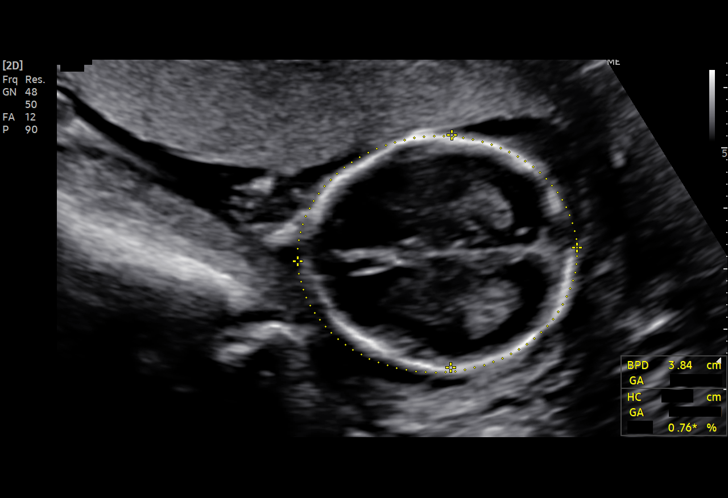
[im 71/84]
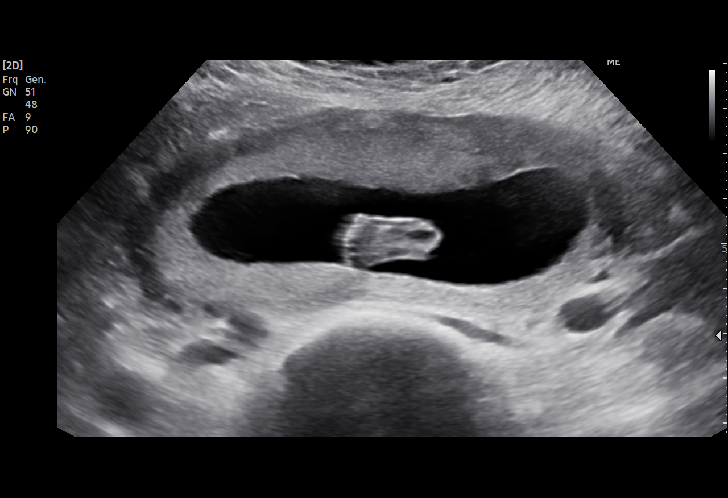
[im 77/84]
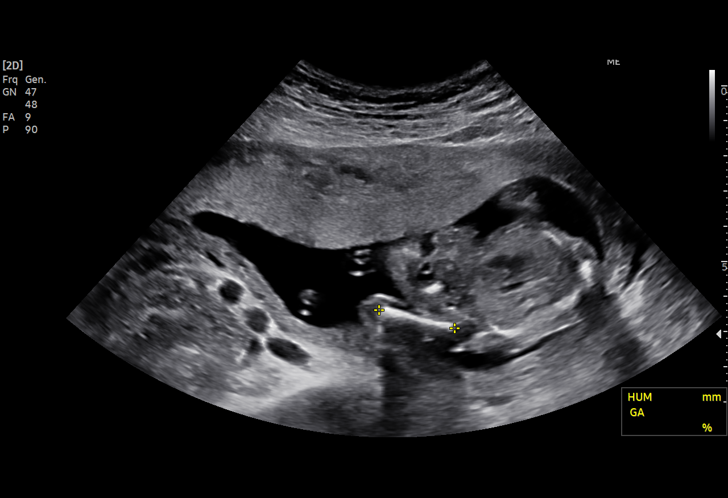
[im 84/84]
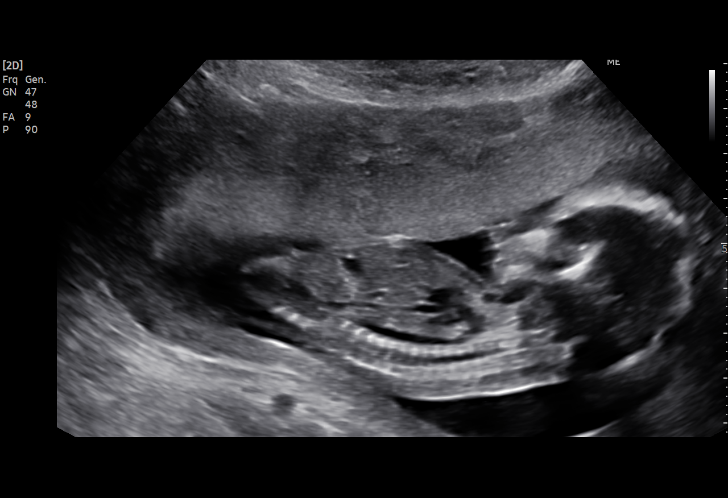

[15 of 28 positions shown; findings below may reference images not displayed]

FINDINGS: Number of Fetuses: 1

Heart Rate:  145 bpm

Movement: Yes

Presentation: Cephalic

Previa: No

Placental Location: Anterior

Amniotic Fluid (Subjective): Within normal limits

Amniotic Fluid (Objective):

Vertical pocket = 2.5cm

FETAL BIOMETRY

BPD: 3.8cm 17w 4d

HC:   13.5cm 17w 0d

AC:   11.9cm 17w 4d

FL:   2.3cm 17w 0d

Current Mean GA: 17w 3d US EDC: 03/22/2022

FETAL ANATOMY

Lateral Ventricles: Appears normal

Thalami/CSP: Appears normal

Posterior Fossa:  Appears normal

Nuchal Region: Appears normal   NFT= 2.8 mm

Upper Lip: Appears normal

Spine: Not visualized

4 Chamber Heart on Left: Appears normal

LVOT: Appears normal

RVOT: Not visualized

Stomach on Left: Appears normal

3 Vessel Cord: Appears normal

Cord Insertion site: Appears normal

Kidneys: Appears normal

Bladder: Appears normal

Extremities: Appears normal

Sex: Male

Technically difficult due to: Fetal position

Maternal Findings:

Cervix:  3.8 cm TA
IMPRESSION: Single living IUP with estimated gestational age of 17 weeks 3 days,
and US EDC of 03/22/2022.

No fetal anomalies identified, although spine and RVOT could not be
visualized. Consider followup ultrasound in 3-4 weeks to complete
anatomic evaluation.

## 2023-11-20 ENCOUNTER — Ambulatory Visit: Admission: EM | Admit: 2023-11-20 | Discharge: 2023-11-20 | Disposition: A | Discharge status: Home / Self Care

## 2023-11-20 DIAGNOSIS — L237 Allergic contact dermatitis due to plants, except food: Secondary | ICD-10-CM | POA: Diagnosis not present

## 2023-11-20 MED ORDER — DEXAMETHASONE SODIUM PHOSPHATE 10 MG/ML IJ SOLN
10.0000 mg | Freq: Once | INTRAMUSCULAR | Status: AC
  Administered 2023-11-20: 10 mg via INTRAMUSCULAR

## 2023-11-20 MED ORDER — PREDNISONE 20 MG PO TABS: ORAL_TABLET | ORAL | 0 refills | Status: AC

## 2023-11-20 NOTE — Discharge Instructions (Addendum)
  1. Allergic contact dermatitis due to plants, except food (Primary) - dexamethasone  (DECADRON ) IM injection 10 mg given in UC for pruritic skin rash. - predniSONE (DELTASONE) 20 MG tablet; Take 3 tablets (60 mg total) by mouth daily for 3 days, THEN 2 tablets (40 mg total) daily for 4 days, THEN 1 tablet (20 mg total) daily for 3 days.  Dispense: 20 tablet; Refill: 0 - Take daily oral antihistamine such as Claritin, Allegra, Zyrtec for 15 days, throughout entire course of steroids +5 extra days to prevent rebound rash from occurring. - You may continue to use over-the-counter hydrocortisone cream over rash in the most itchy spots to decrease itching and dry up skin irritation. -Continue to monitor symptoms for any change in severity if there is any escalation of current symptoms or development of new symptoms follow-up in ER for further evaluation and management.

## 2023-11-20 NOTE — ED Triage Notes (Signed)
 Pt c/o possible posion oak/ivy x5days  Pt states that the rash is along her forehead, face, arms, and legs  Pt has used OTC benadryl , oatmeal baths, and cortisone cream and it is not helping

## 2023-11-20 NOTE — ED Provider Notes (Signed)
 [facility]  Note:  This document was prepared using Conservation officer, historic buildings and may include unintentional dictation errors.  MRN: [id number] DOB: [birth year]  Subjective:   Pam Mitchell is a 33 y.o. female presenting for an extremely pruritic skin rash over forehead, face, bilateral arms, legs x 5 days.  Patient believes it may be due to poison ivy or poison oak exposure.  Patient has been using Benadryl , open baths, cortisone cream with minimal improvement.  She states that she believes the rash is continuing to spread and would like alternative therapy.  Denies any swelling to lips or face, no difficulty breathing or swallowing, no stridor, no wheezing, no chest pain.   Current Facility-Administered Medications:    dexamethasone  (DECADRON ) injection 10 mg, 10 mg, Intramuscular, Once, Kanav Kazmierczak B, NP  Current Outpatient Medications:    predniSONE (DELTASONE) 20 MG tablet, Take 3 tablets (60 mg total) by mouth daily for 3 days, THEN 2 tablets (40 mg total) daily for 4 days, THEN 1 tablet (20 mg total) daily for 3 days., Disp: 20 tablet, Rfl: 0   Fe Fum-Vit C-Vit B12-FA 200-250-0.01-1 MG CAPS, Take 1 tablet by mouth daily. (Patient not taking: Reported on 05/08/2022), Disp: 30 capsule, Rfl: 1   No Known Allergies  Past Medical History:  Diagnosis Date   Anemia during pregnancy in third trimester 06/16/2019   Encounter for induction of labor 09/01/2019   Normal vaginal delivery 09/01/2019   Supervision of other normal pregnancy, antepartum 02/01/2019   [facility] Prenatal Labs Dating L=11 Blood type: A/Positive/-- (08/03 1518)  Genetic Screen Declined 8/18 Antibody:Negative (08/03 1518) Anatomic US  Complete Rubella: 3.09 (08/03 1518) Varicella: Non-immune GTT  Third trimester:WNL  RPR: Non Reactive (08/03 1518)  Rhogam N/A HBsAg: Negative (08/03 1518)  TDaP vaccine  07/02/19   Flu Shot: 10/14 HIV: Non Reactive (08/03 1518)  Baby Food Breast     Supervision  of other normal pregnancy, antepartum 02/01/2019   [facility] Prenatal Labs  Dating L=11 Blood type: A/Positive/-- (08/03 1518)   Genetic Screen Declined 8/18 Antibody:Negative (08/03 1518)  Anatomic US  Complete Rubella: 3.09 (08/03 1518) Varicella: Non-immune  GTT  Third trimester:WNL  RPR: Non Reactive (08/03 1518)   Rhogam N/A HBsAg: Negative (08/03 1518)   TDaP vaccine  07/02/19   Flu Shot: 10/14 HIV: Non Reactive (08/03 1518)   Baby Food B     Past Surgical History:  Procedure Laterality Date   TONSILLECTOMY AND ADENOIDECTOMY  2006   TUBAL LIGATION Bilateral 03/22/2022   Procedure: POST PARTUM TUBAL LIGATION;  Surgeon: Zenobia Hila, MD;  Location: [facility] ORS;  Service: Gynecology;  Laterality: Bilateral;    Family History  Problem Relation Age of Onset   Ovarian cancer Paternal Grandmother     Social History   Tobacco Use   Smoking status: Former    Types: Cigarettes   Smokeless tobacco: Never  Vaping Use   Vaping status: Never Used  Substance Use Topics   Alcohol use: Not Currently    Comment: occassional   Drug use: Not Currently    ROS Refer to HPI for ROS details.  Objective:   Vitals: BP 114/85 (BP Location: Left Arm)   Pulse 62   Temp 98.4 F (36.9 C) (Oral)   Ht 5\' 6"  (1.676 m)   Wt 145 lb (65.8 kg)   LMP 11/02/2023   SpO2 100%   BMI 23.40 kg/m   Physical Exam Vitals and nursing note reviewed.  Constitutional:  General: She is not in acute distress.    Appearance: Normal appearance. She is well-developed. She is not ill-appearing or toxic-appearing.  HENT:     Head: Normocephalic and atraumatic.     Mouth/Throat:     Mouth: Mucous membranes are moist.  Cardiovascular:     Rate and Rhythm: Normal rate.  Pulmonary:     Effort: Pulmonary effort is normal. No respiratory distress.  Musculoskeletal:        General: Normal range of motion.  Skin:    General: Skin is warm and dry.     Findings: Erythema and rash present. Rash is papular.   Neurological:     General: No focal deficit present.     Mental Status: She is alert and oriented to person, place, and time.  Psychiatric:        Mood and Affect: Mood normal.        Behavior: Behavior normal.     Procedures  No results found for this or any previous visit (from the past 24 hours).  Assessment and Plan :     Discharge Instructions       1. Allergic contact dermatitis due to plants, except food (Primary) - dexamethasone  (DECADRON ) IM injection 10 mg given in UC for pruritic skin rash. - predniSONE  (DELTASONE ) 20 MG tablet; Take 3 tablets (60 mg total) by mouth daily for 3 days, THEN 2 tablets (40 mg total) daily for 4 days, THEN 1 tablet (20 mg total) daily for 3 days.  Dispense: 20 tablet; Refill: 0 - Take daily oral antihistamine such as Claritin, Allegra, Zyrtec for 15 days, throughout entire course of steroids +5 extra days to prevent rebound rash from occurring. - You may continue to use over-the-counter hydrocortisone cream over rash in the most itchy spots to decrease itching and dry up skin irritation. -Continue to monitor symptoms for any change in severity if there is any escalation of current symptoms or development of new symptoms follow-up in ER for further evaluation and management.    Brenley Priore B Dai Mcadams   Millette Halberstam, [place], [place] 11/20/23 1451
